# Patient Record
Sex: Male | Born: 1960 | Race: White | Hispanic: No | Marital: Married | Smoking: Never smoker
Health system: Southern US, Community
[De-identification: ages and names within clinical notes are randomized; demographics above are authoritative.]

## PROBLEM LIST (undated history)

## (undated) DIAGNOSIS — Z87442 Personal history of urinary calculi: Secondary | ICD-10-CM

## (undated) DIAGNOSIS — R7302 Impaired glucose tolerance (oral): Secondary | ICD-10-CM

## (undated) DIAGNOSIS — T7840XA Allergy, unspecified, initial encounter: Secondary | ICD-10-CM

## (undated) DIAGNOSIS — E785 Hyperlipidemia, unspecified: Secondary | ICD-10-CM

## (undated) DIAGNOSIS — I10 Essential (primary) hypertension: Secondary | ICD-10-CM

## (undated) DIAGNOSIS — B029 Zoster without complications: Secondary | ICD-10-CM

## (undated) DIAGNOSIS — G459 Transient cerebral ischemic attack, unspecified: Secondary | ICD-10-CM

## (undated) DIAGNOSIS — D229 Melanocytic nevi, unspecified: Secondary | ICD-10-CM

## (undated) HISTORY — PX: WISDOM TOOTH EXTRACTION: SHX21

## (undated) HISTORY — DX: Allergy, unspecified, initial encounter: T78.40XA

## (undated) HISTORY — DX: Zoster without complications: B02.9

## (undated) HISTORY — DX: Impaired glucose tolerance (oral): R73.02

## (undated) HISTORY — PX: OTHER SURGICAL HISTORY: SHX169

## (undated) HISTORY — DX: Personal history of urinary calculi: Z87.442

## (undated) HISTORY — DX: Transient cerebral ischemic attack, unspecified: G45.9

## (undated) HISTORY — DX: Essential (primary) hypertension: I10

## (undated) HISTORY — DX: Hyperlipidemia, unspecified: E78.5

---
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
## 1998-01-19 ENCOUNTER — Encounter: Admission: RE | Admit: 1998-01-19 | Discharge: 1998-01-19 | Payer: Self-pay | Admitting: Family Medicine

## 1998-02-12 ENCOUNTER — Encounter: Admission: RE | Admit: 1998-02-12 | Discharge: 1998-02-12 | Payer: Self-pay | Admitting: Family Medicine

## 1998-03-04 ENCOUNTER — Encounter: Admission: RE | Admit: 1998-03-04 | Discharge: 1998-03-04 | Payer: Self-pay | Admitting: Family Medicine

## 1998-08-27 ENCOUNTER — Encounter: Admission: RE | Admit: 1998-08-27 | Discharge: 1998-08-27 | Payer: Self-pay | Admitting: Family Medicine

## 2000-07-11 ENCOUNTER — Encounter: Payer: Self-pay | Admitting: Urology

## 2000-07-11 ENCOUNTER — Encounter: Admission: RE | Admit: 2000-07-11 | Discharge: 2000-07-11 | Payer: Self-pay | Admitting: Urology

## 2002-02-07 ENCOUNTER — Emergency Department (HOSPITAL_COMMUNITY): Admission: EM | Admit: 2002-02-07 | Discharge: 2002-02-08 | Payer: Self-pay | Admitting: Emergency Medicine

## 2002-02-08 ENCOUNTER — Encounter: Payer: Self-pay | Admitting: Emergency Medicine

## 2002-02-27 ENCOUNTER — Encounter: Payer: Self-pay | Admitting: Urology

## 2002-02-27 ENCOUNTER — Encounter: Admission: RE | Admit: 2002-02-27 | Discharge: 2002-02-27 | Payer: Self-pay | Admitting: Urology

## 2004-09-30 ENCOUNTER — Ambulatory Visit: Payer: Self-pay | Admitting: Internal Medicine

## 2005-01-13 ENCOUNTER — Ambulatory Visit: Payer: Self-pay | Admitting: Internal Medicine

## 2005-04-14 ENCOUNTER — Ambulatory Visit: Payer: Self-pay | Admitting: Internal Medicine

## 2005-04-19 ENCOUNTER — Ambulatory Visit: Payer: Self-pay | Admitting: Internal Medicine

## 2007-09-14 ENCOUNTER — Encounter: Payer: Self-pay | Admitting: Internal Medicine

## 2008-03-28 DIAGNOSIS — G459 Transient cerebral ischemic attack, unspecified: Secondary | ICD-10-CM

## 2008-03-28 HISTORY — DX: Transient cerebral ischemic attack, unspecified: G45.9

## 2008-08-01 ENCOUNTER — Encounter: Admission: RE | Admit: 2008-08-01 | Discharge: 2008-08-01 | Payer: Self-pay | Admitting: Occupational Medicine

## 2009-01-08 ENCOUNTER — Ambulatory Visit: Payer: Self-pay | Admitting: Internal Medicine

## 2009-01-08 DIAGNOSIS — L0291 Cutaneous abscess, unspecified: Secondary | ICD-10-CM | POA: Insufficient documentation

## 2009-01-08 DIAGNOSIS — Z87442 Personal history of urinary calculi: Secondary | ICD-10-CM

## 2009-01-08 DIAGNOSIS — E785 Hyperlipidemia, unspecified: Secondary | ICD-10-CM | POA: Insufficient documentation

## 2009-01-08 DIAGNOSIS — F329 Major depressive disorder, single episode, unspecified: Secondary | ICD-10-CM | POA: Insufficient documentation

## 2009-01-08 DIAGNOSIS — L0390 Cellulitis, unspecified: Secondary | ICD-10-CM

## 2009-01-08 DIAGNOSIS — J309 Allergic rhinitis, unspecified: Secondary | ICD-10-CM | POA: Insufficient documentation

## 2009-01-08 HISTORY — DX: Personal history of urinary calculi: Z87.442

## 2009-01-08 HISTORY — DX: Hyperlipidemia, unspecified: E78.5

## 2009-01-12 ENCOUNTER — Encounter: Payer: Self-pay | Admitting: Internal Medicine

## 2009-01-22 ENCOUNTER — Ambulatory Visit: Payer: Self-pay | Admitting: Internal Medicine

## 2009-01-22 DIAGNOSIS — H6990 Unspecified Eustachian tube disorder, unspecified ear: Secondary | ICD-10-CM | POA: Insufficient documentation

## 2009-01-22 DIAGNOSIS — H6980 Other specified disorders of Eustachian tube, unspecified ear: Secondary | ICD-10-CM | POA: Insufficient documentation

## 2009-02-18 ENCOUNTER — Ambulatory Visit: Payer: Self-pay | Admitting: Internal Medicine

## 2009-02-18 LAB — CONVERTED CEMR LAB
ALT: 43 units/L (ref 0–53)
AST: 23 units/L (ref 0–37)
Albumin: 4.7 g/dL (ref 3.5–5.2)
Alkaline Phosphatase: 50 units/L (ref 39–117)
BUN: 12 mg/dL (ref 6–23)
Basophils Absolute: 0 10*3/uL (ref 0.0–0.1)
Basophils Relative: 0.7 % (ref 0.0–3.0)
Bilirubin Urine: NEGATIVE
Bilirubin, Direct: 0.1 mg/dL (ref 0.0–0.3)
CO2: 30 meq/L (ref 19–32)
Calcium: 9.7 mg/dL (ref 8.4–10.5)
Chloride: 105 meq/L (ref 96–112)
Cholesterol: 219 mg/dL — ABNORMAL HIGH (ref 0–200)
Creatinine, Ser: 1.1 mg/dL (ref 0.4–1.5)
Direct LDL: 167.8 mg/dL
Eosinophils Absolute: 0.1 10*3/uL (ref 0.0–0.7)
Eosinophils Relative: 2.2 % (ref 0.0–5.0)
GFR calc non Af Amer: 76.4 mL/min (ref >60)
Glucose, Bld: 109 mg/dL — ABNORMAL HIGH (ref 70–99)
HCT: 47.4 % (ref 39.0–52.0)
HDL: 47.5 mg/dL (ref >39.00)
Hemoglobin, Urine: NEGATIVE
Hemoglobin: 16.1 g/dL (ref 13.0–17.0)
Ketones, ur: NEGATIVE mg/dL
Leukocytes, UA: NEGATIVE
Lymphocytes Relative: 30.9 % (ref 12.0–46.0)
Lymphs Abs: 1.9 10*3/uL (ref 0.7–4.0)
MCHC: 33.9 g/dL (ref 30.0–36.0)
MCV: 92.9 fL (ref 78.0–100.0)
Monocytes Absolute: 0.5 10*3/uL (ref 0.1–1.0)
Monocytes Relative: 8.2 % (ref 3.0–12.0)
Neutro Abs: 3.5 10*3/uL (ref 1.4–7.7)
Neutrophils Relative %: 58 % (ref 43.0–77.0)
Nitrite: NEGATIVE
PSA: 0.54 ng/mL (ref 0.10–4.00)
Platelets: 232 10*3/uL (ref 150.0–400.0)
Potassium: 4.5 meq/L (ref 3.5–5.1)
RBC: 5.1 M/uL (ref 4.22–5.81)
RDW: 12.7 % (ref 11.5–14.6)
Sodium: 142 meq/L (ref 135–145)
Specific Gravity, Urine: 1.015 (ref 1.000–1.030)
TSH: 1.31 microintl units/mL (ref 0.35–5.50)
Total Bilirubin: 1 mg/dL (ref 0.3–1.2)
Total CHOL/HDL Ratio: 5
Total Protein, Urine: NEGATIVE mg/dL
Total Protein: 7.5 g/dL (ref 6.0–8.3)
Triglycerides: 103 mg/dL (ref 0.0–149.0)
Urine Glucose: NEGATIVE mg/dL
Urobilinogen, UA: 0.2 (ref 0.0–1.0)
VLDL: 20.6 mg/dL (ref 0.0–40.0)
WBC: 6 10*3/uL (ref 4.5–10.5)
pH: 6.5 (ref 5.0–8.0)

## 2009-03-02 ENCOUNTER — Ambulatory Visit: Payer: Self-pay | Admitting: Internal Medicine

## 2009-03-02 DIAGNOSIS — G4482 Headache associated with sexual activity: Secondary | ICD-10-CM | POA: Insufficient documentation

## 2009-08-22 ENCOUNTER — Ambulatory Visit: Payer: Self-pay | Admitting: Family Medicine

## 2009-08-22 DIAGNOSIS — J029 Acute pharyngitis, unspecified: Secondary | ICD-10-CM | POA: Insufficient documentation

## 2009-08-22 DIAGNOSIS — J069 Acute upper respiratory infection, unspecified: Secondary | ICD-10-CM | POA: Insufficient documentation

## 2009-08-22 LAB — CONVERTED CEMR LAB: Rapid Strep: NEGATIVE

## 2009-10-07 ENCOUNTER — Emergency Department (HOSPITAL_COMMUNITY): Admission: EM | Admit: 2009-10-07 | Discharge: 2009-10-07 | Payer: Self-pay | Admitting: Family Medicine

## 2009-10-07 ENCOUNTER — Observation Stay (HOSPITAL_COMMUNITY): Admission: EM | Admit: 2009-10-07 | Discharge: 2009-10-08 | Payer: Self-pay | Admitting: Emergency Medicine

## 2010-04-27 NOTE — Consult Note (Signed)
Summary: Consultation Report/[facility]  Consultation Report/[facility]   Imported By: Lester Dawson 09/24/2007 11:30:47  _____________________________________________________________________  External Attachment:    Type:   Image     Comment:   External Document

## 2010-04-27 NOTE — Consult Note (Signed)
Summary: Abcess/[facility]  Abcess/[facility]   Imported By: Sherian Rein 02/03/2009 09:30:37  _____________________________________________________________________  External Attachment:    Type:   Image     Comment:   External Document

## 2010-04-27 NOTE — Assessment & Plan Note (Signed)
Summary: SORE THROAT/[facility]   Vital Signs:  Patient profile:   50 year old male Weight:      224 pounds Temp:     97.8 degrees F oral BP sitting:   122 / 80  (left arm)  Vitals Entered By: Doristine Devoid (Aug 22, 2009 9:39 AM) CC: sore throat and R ear pain   Acute Visit History:      The patient complains of cough, fever, and sore throat.  He denies chest pain, earache, nasal discharge, and rash.  Other comments include: ringing in right ear using coricidin. Marland Kitchen        His highest temperature has been subjective.        The cough interferes with his sleep.  The character of the cough is described as nonproductive.  There is no history of wheezing or shortness of breath associated with his cough.        The earache is located on the right side.        Problems Prior to Update: 1)  Sore Throat  (ICD-462) 2)  Preventive Health Care  (ICD-V70.0) 3)  Headache Associated With Sexual Activity  (ICD-339.82) 4)  Eustachian Tube Dysfunction, Bilateral  (ICD-381.81) 5)  Depression  (ICD-311) 6)  Nephrolithiasis, Hx of  (ICD-V13.01) 7)  Hyperlipidemia  (ICD-272.4) 8)  Allergic Rhinitis  (ICD-477.9) 9)  Cellulitis and Abscess of Unspecified Site  (ICD-682.9)  Current Medications (verified): 1)  Nasacort Aq 55 Mcg/act Aers (Triamcinolone Acetonide(Nasal)) .... 2 Puffs Each Nostril Once Daily 2)  Indomethacin Cr 75 Mg Cr-Caps (Indomethacin) .Marland Kitchen.. 1 By Mouth Bid As Needed For Headaches 3)  Crestor 20 Mg Tabs (Rosuvastatin Calcium) .Marland Kitchen.. 1 By Mouth Once Daily 4)  Cheratussin Ac 100-10 Mg/27ml Syrp (Guaifenesin-Codeine) .Marland Kitchen.. 1-2 Tsp By Mouth At Bedtime As Needed Cough 5)  Azithromycin 250 Mg Tabs (Azithromycin) .... 2 Tab Ppo X 1 Then 1 Tab By Mouth Daily  Fill If Not Improving in 3-4 Days As Expected With Viral Infection.  Void After June 6th, 2011  Allergies (verified): 1)  Vytorin (Ezetimibe-Simvastatin)  Past History:  Past medical, surgical, family and social histories (including risk  factors) reviewed, and no changes noted (except as noted below).  Past Medical History: Reviewed history from 01/08/2009 and no changes required. Allergic rhinitis Hyperlipidemia Nephrolithiasis, hx of hx of UTI Depression normal stress test 2005  Past Surgical History: Reviewed history from 01/08/2009 and no changes required. s/p ganglion cyst s/p pilonidalcystectomy  Family History: Reviewed history from 01/08/2009 and no changes required. parent with heart disease, HTN mother with HTN, carotid disease  Social History: Reviewed history from 01/08/2009 and no changes required. 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 CV:  Denies chest pain or discomfort. GI:  Denies abdominal pain.  Physical Exam  General:  overwieght male in AND Ears:  clear fluid B TMs, no erythema, no exudate Nose:  External nasal examination shows no deformity or inflammation. Nasal mucosa are pink and moist without lesions or exudates. Mouth:  MMMno exudates, no posterior lymphoid hypertrophy, and pharyngeal erythema.   Neck:  no cervical or supraclavicular lymphadenopathy  Lungs:  Normal respiratory effort, chest expands symmetrically. Lungs are clear to auscultation, no crackles or wheezes. Heart:  Normal rate and regular rhythm. S1 and S2 normal without gallop, murmur, click, rub or other extra sounds.   Impression & Recommendations:  Problem # 1:  URI (ICD-465.9) No  clear bacterial infection, but not improving at day 5-6 ...treat symptomatically for 3-4 more days.Marland Kitchenif not turning the corner, may fill Rx for antibiotics for possible early bronchitis.  His updated medication list for this problem includes:    Indomethacin Cr 75 Mg Cr-caps (Indomethacin) .Marland Kitchen... 1 by mouth bid as needed for headaches    Cheratussin Ac 100-10 Mg/45ml Syrp (Guaifenesin-codeine) .Marland Kitchen... 1-2 tsp by mouth at bedtime as needed  cough  Complete Medication List: 1)  Nasacort Aq 55 Mcg/act Aers (Triamcinolone acetonide(nasal)) .... 2 puffs each nostril once daily 2)  Indomethacin Cr 75 Mg Cr-caps (Indomethacin) .Marland Kitchen.. 1 by mouth bid as needed for headaches 3)  Crestor 20 Mg Tabs (Rosuvastatin calcium) .Marland Kitchen.. 1 by mouth once daily 4)  Cheratussin Ac 100-10 Mg/68ml Syrp (Guaifenesin-codeine) .Marland Kitchen.. 1-2 tsp by mouth at bedtime as needed cough 5)  Azithromycin 250 Mg Tabs (Azithromycin) .... 2 tab ppo x 1 then 1 tab by mouth daily  fill if not improving in 3-4 days as expected with viral infection.  void after june 6th, 2011  Other Orders: Rapid Strep [id number])  Patient Instructions: 1)  Treat sore throat with ibuprofen, cough with mucinex DM and cough suppressant at night. 2)   May fill antibiotics in 7-10 days if not turning the corner.  Prescriptions: AZITHROMYCIN 250 MG TABS (AZITHROMYCIN) 2 tab ppo x 1 then 1 tab by mouth daily  Fill if not improving in 3-4 days as expected with viral infection.  VOID after June 6th, 2011  #6 x 0   Entered and Authorized by:   Kerby Nora MD   Signed by:   Kerby Nora MD on 08/22/2009   Method used:   Print then Give to Patient   RxID:   [id number] CHERATUSSIN AC 100-10 MG/5ML SYRP (GUAIFENESIN-CODEINE) 1-2 tsp by mouth at bedtime as needed cough  #8 oz x 0   Entered and Authorized by:   Kerby Nora MD   Signed by:   Kerby Nora MD on 08/22/2009   Method used:   Print then Give to Patient   RxID:   [id number]   Laboratory Results    Other Tests  Rapid Strep: negative  Kit Test Internal QC: Positive   (Normal Range: Negative)

## 2010-06-13 LAB — COMPREHENSIVE METABOLIC PANEL
ALT: 43 U/L (ref 0–53)
ALT: 48 U/L (ref 0–53)
AST: 25 U/L (ref 0–37)
AST: 31 U/L (ref 0–37)
Albumin: 4 g/dL (ref 3.5–5.2)
Albumin: 4.7 g/dL (ref 3.5–5.2)
Alkaline Phosphatase: 51 U/L (ref 39–117)
Alkaline Phosphatase: 55 U/L (ref 39–117)
BUN: 12 mg/dL (ref 6–23)
BUN: 9 mg/dL (ref 6–23)
CO2: 29 mEq/L (ref 19–32)
CO2: 29 mEq/L (ref 19–32)
Calcium: 9.4 mg/dL (ref 8.4–10.5)
Calcium: 9.9 mg/dL (ref 8.4–10.5)
Chloride: 102 mEq/L (ref 96–112)
Chloride: 104 mEq/L (ref 96–112)
Creatinine, Ser: 1.06 mg/dL (ref 0.4–1.5)
Creatinine, Ser: 1.17 mg/dL (ref 0.4–1.5)
GFR calc Af Amer: >60 mL/min (ref >60)
GFR calc Af Amer: >60 mL/min (ref >60)
GFR calc non Af Amer: >60 mL/min (ref >60)
GFR calc non Af Amer: >60 mL/min (ref >60)
Glucose, Bld: 105 mg/dL — ABNORMAL HIGH (ref 70–99)
Glucose, Bld: 121 mg/dL — ABNORMAL HIGH (ref 70–99)
Potassium: 4.2 mEq/L (ref 3.5–5.1)
Potassium: 4.3 mEq/L (ref 3.5–5.1)
Sodium: 141 mEq/L (ref 135–145)
Sodium: 143 mEq/L (ref 135–145)
Total Bilirubin: 0.4 mg/dL (ref 0.3–1.2)
Total Bilirubin: 1 mg/dL (ref 0.3–1.2)
Total Protein: 6.2 g/dL (ref 6.0–8.3)
Total Protein: 7.4 g/dL (ref 6.0–8.3)

## 2010-06-13 LAB — PROTIME-INR
INR: 0.97 (ref 0.00–1.49)
Prothrombin Time: 12.8 seconds (ref 11.6–15.2)

## 2010-06-13 LAB — DIFFERENTIAL
Basophils Absolute: 0 10*3/uL (ref 0.0–0.1)
Basophils Relative: 0 % (ref 0–1)
Eosinophils Absolute: 0.1 10*3/uL (ref 0.0–0.7)
Eosinophils Relative: 1 % (ref 0–5)
Lymphocytes Relative: 25 % (ref 12–46)
Lymphs Abs: 2.5 10*3/uL (ref 0.7–4.0)
Monocytes Absolute: 0.6 10*3/uL (ref 0.1–1.0)
Monocytes Relative: 6 % (ref 3–12)
Neutro Abs: 6.7 10*3/uL (ref 1.7–7.7)
Neutrophils Relative %: 67 % (ref 43–77)

## 2010-06-13 LAB — CBC
HCT: 45.3 % (ref 39.0–52.0)
HCT: 48 % (ref 39.0–52.0)
Hemoglobin: 15.2 g/dL (ref 13.0–17.0)
Hemoglobin: 16.2 g/dL (ref 13.0–17.0)
MCH: 30.7 pg (ref 26.0–34.0)
MCH: 30.8 pg (ref 26.0–34.0)
MCHC: 33.6 g/dL (ref 30.0–36.0)
MCHC: 33.8 g/dL (ref 30.0–36.0)
MCV: 90.7 fL (ref 78.0–100.0)
MCV: 91.5 fL (ref 78.0–100.0)
Platelets: 221 10*3/uL (ref 150–400)
Platelets: 257 10*3/uL (ref 150–400)
RBC: 4.95 MIL/uL (ref 4.22–5.81)
RBC: 5.29 MIL/uL (ref 4.22–5.81)
RDW: 13.2 % (ref 11.5–15.5)
RDW: 13.3 % (ref 11.5–15.5)
WBC: 7.3 10*3/uL (ref 4.0–10.5)
WBC: 9.9 10*3/uL (ref 4.0–10.5)

## 2010-06-13 LAB — CARDIAC PANEL(CRET KIN+CKTOT+MB+TROPI)
CK, MB: 2.3 ng/mL (ref 0.3–4.0)
CK, MB: 3 ng/mL (ref 0.3–4.0)
Relative Index: 0.9 (ref 0.0–2.5)
Relative Index: 1.2 (ref 0.0–2.5)
Total CK: 256 U/L — ABNORMAL HIGH (ref 7–232)
Total CK: 260 U/L — ABNORMAL HIGH (ref 7–232)
Troponin I: <0.01 ng/mL (ref 0.00–0.06)
Troponin I: <0.01 ng/mL (ref 0.00–0.06)

## 2010-06-13 LAB — HEMOGLOBIN A1C
Hgb A1c MFr Bld: 6 % — ABNORMAL HIGH (ref <5.7)
Mean Plasma Glucose: 126 mg/dL — ABNORMAL HIGH (ref <117)

## 2010-06-13 LAB — TSH: TSH: 3.956 u[IU]/mL (ref 0.350–4.500)

## 2010-06-13 LAB — ACETYLCHOLINE RECEPTOR, BINDING: Acetylcholine Receptor Ab: <0.3 nmol/L (ref <=0.30)

## 2010-06-13 LAB — GLUCOSE, CAPILLARY
Glucose-Capillary: 125 mg/dL — ABNORMAL HIGH (ref 70–99)
Glucose-Capillary: 133 mg/dL — ABNORMAL HIGH (ref 70–99)

## 2010-06-13 LAB — MAGNESIUM: Magnesium: 2.2 mg/dL (ref 1.5–2.5)

## 2010-06-13 LAB — CK TOTAL AND CKMB (NOT AT ARMC)
CK, MB: 2.9 ng/mL (ref 0.3–4.0)
Relative Index: 1.1 (ref 0.0–2.5)
Total CK: 270 U/L — ABNORMAL HIGH (ref 7–232)

## 2010-06-13 LAB — MYASTHENIA GRAVIS PANEL 2

## 2010-06-13 LAB — PHOSPHORUS: Phosphorus: 3.3 mg/dL (ref 2.3–4.6)

## 2010-06-13 LAB — SEDIMENTATION RATE: Sed Rate: 1 mm/hr (ref 0–16)

## 2010-06-13 LAB — B. BURGDORFI ANTIBODIES: B burgdorferi Ab IgG+IgM: 0.21 {ISR}

## 2010-06-13 LAB — TROPONIN I: Troponin I: <0.01 ng/mL (ref 0.00–0.06)

## 2010-07-02 ENCOUNTER — Other Ambulatory Visit: Payer: Self-pay | Admitting: Internal Medicine

## 2010-07-06 ENCOUNTER — Telehealth: Payer: Self-pay | Admitting: Internal Medicine

## 2010-07-06 NOTE — Telephone Encounter (Signed)
Pt needs 2-3 mos of refills of Crestor. Pt made appt for CPX on June 26th (next available appt for CPX). [facility] [address] is his pharmacy.

## 2010-07-07 MED ORDER — ROSUVASTATIN CALCIUM 20 MG PO TABS: 20.0000 mg | ORAL_TABLET | Freq: Every day | ORAL | Status: DC

## 2010-09-14 ENCOUNTER — Other Ambulatory Visit (INDEPENDENT_AMBULATORY_CARE_PROVIDER_SITE_OTHER): Payer: 59

## 2010-09-14 ENCOUNTER — Other Ambulatory Visit: Payer: Self-pay | Admitting: Internal Medicine

## 2010-09-14 DIAGNOSIS — Z0000 Encounter for general adult medical examination without abnormal findings: Secondary | ICD-10-CM

## 2010-09-14 DIAGNOSIS — Z0389 Encounter for observation for other suspected diseases and conditions ruled out: Secondary | ICD-10-CM

## 2010-09-14 LAB — CBC WITH DIFFERENTIAL/PLATELET
Basophils Absolute: 0 10*3/uL (ref 0.0–0.1)
Basophils Relative: 0.4 % (ref 0.0–3.0)
Eosinophils Absolute: 0.2 10*3/uL (ref 0.0–0.7)
Eosinophils Relative: 2.5 % (ref 0.0–5.0)
HCT: 47.6 % (ref 39.0–52.0)
Hemoglobin: 16.4 g/dL (ref 13.0–17.0)
Lymphocytes Relative: 30.4 % (ref 12.0–46.0)
Lymphs Abs: 2.1 10*3/uL (ref 0.7–4.0)
MCHC: 34.5 g/dL (ref 30.0–36.0)
MCV: 91.5 fl (ref 78.0–100.0)
Monocytes Absolute: 0.6 10*3/uL (ref 0.1–1.0)
Monocytes Relative: 8.7 % (ref 3.0–12.0)
Neutro Abs: 4.1 10*3/uL (ref 1.4–7.7)
Neutrophils Relative %: 58 % (ref 43.0–77.0)
Platelets: 281 10*3/uL (ref 150.0–400.0)
RBC: 5.2 Mil/uL (ref 4.22–5.81)
RDW: 13.5 % (ref 11.5–14.6)
WBC: 7 10*3/uL (ref 4.5–10.5)

## 2010-09-14 LAB — LIPID PANEL
Cholesterol: 154 mg/dL (ref 0–200)
HDL: 44.6 mg/dL (ref >39.00)
LDL Cholesterol: 88 mg/dL (ref 0–99)
Total CHOL/HDL Ratio: 3
Triglycerides: 108 mg/dL (ref 0.0–149.0)
VLDL: 21.6 mg/dL (ref 0.0–40.0)

## 2010-09-14 LAB — URINALYSIS
Bilirubin Urine: NEGATIVE
Hgb urine dipstick: NEGATIVE
Ketones, ur: NEGATIVE
Leukocytes, UA: NEGATIVE
Nitrite: NEGATIVE
Specific Gravity, Urine: 1.02 (ref 1.000–1.030)
Total Protein, Urine: NEGATIVE
Urine Glucose: NEGATIVE
Urobilinogen, UA: 0.2 (ref 0.0–1.0)
pH: 7 (ref 5.0–8.0)

## 2010-09-14 LAB — HEPATIC FUNCTION PANEL
ALT: 47 U/L (ref 0–53)
AST: 28 U/L (ref 0–37)
Albumin: 5 g/dL (ref 3.5–5.2)
Alkaline Phosphatase: 52 U/L (ref 39–117)
Bilirubin, Direct: 0.2 mg/dL (ref 0.0–0.3)
Total Bilirubin: 0.7 mg/dL (ref 0.3–1.2)
Total Protein: 7.1 g/dL (ref 6.0–8.3)

## 2010-09-14 LAB — TSH: TSH: 1.94 u[IU]/mL (ref 0.35–5.50)

## 2010-09-14 LAB — PSA: PSA: 0.6 ng/mL (ref 0.10–4.00)

## 2010-09-14 LAB — BASIC METABOLIC PANEL
BUN: 15 mg/dL (ref 6–23)
CO2: 29 mEq/L (ref 19–32)
Calcium: 9.7 mg/dL (ref 8.4–10.5)
Chloride: 105 mEq/L (ref 96–112)
Creatinine, Ser: 0.9 mg/dL (ref 0.4–1.5)
GFR: 92.12 mL/min (ref >60.00)
Glucose, Bld: 92 mg/dL (ref 70–99)
Potassium: 4.4 mEq/L (ref 3.5–5.1)
Sodium: 140 mEq/L (ref 135–145)

## 2010-09-20 ENCOUNTER — Encounter: Payer: Self-pay | Admitting: Internal Medicine

## 2010-09-21 ENCOUNTER — Encounter: Payer: Self-pay | Admitting: Internal Medicine

## 2010-09-21 ENCOUNTER — Ambulatory Visit (INDEPENDENT_AMBULATORY_CARE_PROVIDER_SITE_OTHER): Payer: 59 | Admitting: Internal Medicine

## 2010-09-21 VITALS — BP 110/78 | HR 82 | Temp 99.3°F | Ht 67.0 in | Wt 222.5 lb

## 2010-09-21 DIAGNOSIS — J309 Allergic rhinitis, unspecified: Secondary | ICD-10-CM

## 2010-09-21 DIAGNOSIS — R7302 Impaired glucose tolerance (oral): Secondary | ICD-10-CM

## 2010-09-21 DIAGNOSIS — Z0000 Encounter for general adult medical examination without abnormal findings: Secondary | ICD-10-CM

## 2010-09-21 DIAGNOSIS — H532 Diplopia: Secondary | ICD-10-CM | POA: Insufficient documentation

## 2010-09-21 DIAGNOSIS — Z0001 Encounter for general adult medical examination with abnormal findings: Secondary | ICD-10-CM | POA: Insufficient documentation

## 2010-09-21 DIAGNOSIS — E785 Hyperlipidemia, unspecified: Secondary | ICD-10-CM

## 2010-09-21 HISTORY — DX: Impaired glucose tolerance (oral): R73.02

## 2010-09-21 MED ORDER — ROSUVASTATIN CALCIUM 20 MG PO TABS: 20.0000 mg | ORAL_TABLET | Freq: Every day | ORAL | Status: DC

## 2010-09-21 MED ORDER — FLUTICASONE PROPIONATE 50 MCG/ACT NA SUSP: 2.0000 | Freq: Every day | NASAL | Status: DC

## 2010-09-21 NOTE — Patient Instructions (Signed)
Take all new medications as prescribed Continue all other medications as before Please get regular exercise for wt loss, and follow low cholesterol diet Please return in 1 year for your yearly visit, or sooner if needed, with Lab testing done 3-5 days before

## 2010-09-21 NOTE — Assessment & Plan Note (Signed)

## 2010-09-21 NOTE — Assessment & Plan Note (Signed)
stable overall by hx and exam, most recent data reviewed with pt, and pt to continue medical treatment as before  Lab Results  Component Value Date   LDLCALC 88 09/14/2010

## 2010-09-21 NOTE — Progress Notes (Signed)
Subjective:    Patient ID: Brett Griffith, male    DOB: 1960/05/12, 50 y.o.   MRN: [id number]  HPI Here for wellness and f/u;  Overall doing ok;  Pt denies CP, worsening SOB, DOE, wheezing, orthopnea, PND, worsening LE edema, palpitations, dizziness or syncope.  Pt denies neurological change such as new Headache, facial or extremity weakness.  Pt denies polydipsia, polyuria, or low sugar symptoms. Pt states overall good compliance with treatment and medications, good tolerability, and trying to follow lower cholesterol diet.  Pt denies worsening depressive symptoms, suicidal ideation or panic. No fever, wt loss, night sweats, loss of appetite, or other constitutional symptoms.  Pt states good ability with ADL's, low fall risk, home safety reviewed and adequate, no significant changes in hearing or vision, and occasionally active with exercise. Sees Derm once yearly. Past Medical History  Diagnosis Date  . ALLERGIC RHINITIS 01/08/2009  . Cellulitis and abscess of unspecified site 01/08/2009  . DEPRESSION 01/08/2009  . 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 Headache associated with sexual activity 03/02/2009  . HYPERLIPIDEMIA 01/08/2009  . NEPHROLITHIASIS, HX OF 01/08/2009  . SORE THROAT 08/22/2009  . URI 08/22/2009  . Transient diplopia 09/21/2010  . Impaired glucose tolerance 09/21/2010   Past Surgical History  Procedure Date  . S/p ganglion cyst   . S/p pilonidalcystectomy     reports that he has never smoked. He does not have any smokeless tobacco history on file. He reports that he drinks alcohol. His drug history not on file. family history includes Heart disease in his father and mother and Hypertension in his father and mother. Allergies  Allergen Reactions  . Ezetimibe-Simvastatin     REACTION: more emotional   Current Outpatient Prescriptions on File Prior to Visit  Medication Sig Dispense Refill  . Indomethacin 75 MG CPCR Take by mouth 2 (two) times daily as needed.         Marland Kitchen DISCONTD: rosuvastatin (CRESTOR) 20 MG tablet Take 1 tablet (20 mg total) by mouth at bedtime.  30 tablet  2  . guaiFENesin-codeine (ROBITUSSIN AC) 100-10 MG/5ML syrup 1-2 tsp by mouth at bedtime as needed coudh       . DISCONTD: triamcinolone (NASACORT AQ) 55 MCG/ACT nasal inhaler Place 2 sprays into the nose daily.         Review of Systems Review of Systems  Constitutional: Negative for diaphoresis, activity change, appetite change and unexpected weight change.  HENT: Negative for hearing loss, ear pain, facial swelling, mouth sores and neck stiffness.   Eyes: Negative for pain, redness and visual disturbance.  Respiratory: Negative for shortness of breath and wheezing.   Cardiovascular: Negative for chest pain and palpitations.  Gastrointestinal: Negative for diarrhea, blood in stool, abdominal distention and rectal pain.  Genitourinary: Negative for hematuria, flank pain and decreased urine volume.  Musculoskeletal: Negative for myalgias and joint swelling.  Skin: Negative for color change and wound.  Neurological: Negative for syncope and numbness.  Hematological: Negative for adenopathy.  Psychiatric/Behavioral: Negative for hallucinations, self-injury, decreased concentration and agitation.      Objective:   Physical Exam BP 110/78  Pulse 82  Temp(Src) 99.3 F (37.4 C) (Oral)  Ht 5\' 7"  (1.702 m)  Wt 222 lb 8 oz (100.925 kg)  BMI 34.85 kg/m2  SpO2 95% Physical Exam  VS noted Constitutional: Pt is oriented to person, place, and time. Appears well-developed and well-nourished.  HENT:  Head: Normocephalic and atraumatic.  Right Ear: External ear normal.  Left  Ear: External ear normal.  Nose: Nose normal.  Mouth/Throat: Oropharynx is clear and moist.  Eyes: Conjunctivae and EOM are normal. Pupils are equal, round, and reactive to light.  Neck: Normal range of motion. Neck supple. No JVD present. No tracheal deviation present.  Cardiovascular: Normal rate, regular  rhythm, normal heart sounds and intact distal pulses.   Pulmonary/Chest: Effort normal and breath sounds normal.  Abdominal: Soft. Bowel sounds are normal. There is no tenderness.  Musculoskeletal: Normal range of motion. Exhibits no edema.  Lymphadenopathy:  Has no cervical adenopathy.  Neurological: Pt is alert and oriented to person, place, and time. Pt has normal reflexes. No cranial nerve deficit.  Skin: Skin is warm and dry. No rash noted.  Psychiatric:  Has  normal mood and affect. Behavior is normal.         Assessment & Plan:

## 2010-09-21 NOTE — Assessment & Plan Note (Signed)
Mild to mod, for flonas asd,  to f/u any worsening symptoms or concerns

## 2011-09-23 ENCOUNTER — Ambulatory Visit (INDEPENDENT_AMBULATORY_CARE_PROVIDER_SITE_OTHER): Payer: 59 | Admitting: Internal Medicine

## 2011-09-23 ENCOUNTER — Encounter: Payer: Self-pay | Admitting: Internal Medicine

## 2011-09-23 VITALS — BP 112/80 | HR 70 | Temp 97.1°F | Ht 67.0 in | Wt 219.0 lb

## 2011-09-23 DIAGNOSIS — J309 Allergic rhinitis, unspecified: Secondary | ICD-10-CM

## 2011-09-23 DIAGNOSIS — Z0000 Encounter for general adult medical examination without abnormal findings: Secondary | ICD-10-CM

## 2011-09-23 DIAGNOSIS — L259 Unspecified contact dermatitis, unspecified cause: Secondary | ICD-10-CM | POA: Insufficient documentation

## 2011-09-23 DIAGNOSIS — E785 Hyperlipidemia, unspecified: Secondary | ICD-10-CM

## 2011-09-23 MED ORDER — FLUTICASONE PROPIONATE 50 MCG/ACT NA SUSP: 2.0000 | Freq: Every day | NASAL | Status: DC

## 2011-09-23 MED ORDER — HYDROXYZINE HCL 25 MG PO TABS: ORAL_TABLET | ORAL | Status: DC

## 2011-09-23 MED ORDER — ROSUVASTATIN CALCIUM 20 MG PO TABS: 20.0000 mg | ORAL_TABLET | Freq: Every day | ORAL | Status: DC

## 2011-09-23 MED ORDER — METHYLPREDNISOLONE ACETATE 80 MG/ML IJ SUSP
120.0000 mg | Freq: Once | INTRAMUSCULAR | Status: AC
  Administered 2011-09-23: 120 mg via INTRAMUSCULAR

## 2011-09-23 MED ORDER — PREDNISONE 10 MG PO TABS: ORAL_TABLET | ORAL | Status: DC

## 2011-09-23 NOTE — Patient Instructions (Addendum)
You had the steroid shot today Take all new medications as prescribed  - the prednisone, and hydroxizine for itching You can also use benadryl cream topical to help with itching as well Your regular medications were all refilled today as well Please return in 3 mo with Lab testing done 3-5 days before

## 2011-09-24 ENCOUNTER — Encounter: Payer: Self-pay | Admitting: Internal Medicine

## 2011-09-24 NOTE — Progress Notes (Signed)
Subjective:    Patient ID: Brett Griffith, male    DOB: June 05, 1960, 51 y.o.   MRN: [id number]  HPI  Here to f/u; [place] with lots of "vines" now with numerous red itchy areas to arms, legs and anterior chest (which he cant scratch and bothers him to no end when he wears his body armor);   Pt denies fever, wt loss, night sweats, loss of appetite, or other constitutional symptoms  Pt denies chest pain, increased sob or doe, wheezing, orthopnea, PND, increased LE swelling, palpitations, dizziness or syncope.  No tongue or lip swelling, wheezing.  Pt denies new neurological symptoms such as new headache, or facial or extremity weakness or numbness   Pt denies polydipsia, polyuria.  Trying to follow lower chol diet, not always successful. Out of crestor for the past month, and Does have several wks ongoing nasal allergy symptoms with clear congestion, itch and sneeze, without fever, pain, ST, cough or wheezing - out of flonase for over a month. Past Medical History  Diagnosis Date  . ALLERGIC RHINITIS 01/08/2009  . Cellulitis and abscess of unspecified site 01/08/2009  . DEPRESSION 01/08/2009  . 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 Headache associated with sexual activity 03/02/2009  . HYPERLIPIDEMIA 01/08/2009  . NEPHROLITHIASIS, HX OF 01/08/2009  . SORE THROAT 08/22/2009  . URI 08/22/2009  . Transient diplopia 09/21/2010  . Impaired glucose tolerance 09/21/2010   Past Surgical History  Procedure Date  . S/p ganglion cyst   . S/p pilonidalcystectomy     reports that he has never smoked. He does not have any smokeless tobacco history on file. He reports that he drinks alcohol. His drug history not on file. family history includes Heart disease in his father and mother and Hypertension in his father and mother. Allergies  Allergen Reactions  . Ezetimibe-Simvastatin     REACTION: more emotional   Current Outpatient Prescriptions on File Prior to Visit    Medication Sig Dispense Refill  . Indomethacin 75 MG CPCR Take by mouth 2 (two) times daily as needed.        . fluticasone (FLONASE) 50 MCG/ACT nasal spray Place 2 sprays into the nose daily.  16 g  2  . rosuvastatin (CRESTOR) 20 MG tablet Take 1 tablet (20 mg total) by mouth at bedtime.  90 tablet  3   No current facility-administered medications on file prior to visit.   Review of Systems Review of Systems  Constitutional: Negative for diaphoresis and unexpected weight change.  Eyes: Negative for photophobia and visual disturbance.  Respiratory: Negative for choking and stridor.   Gastrointestinal: Negative for vomiting and blood in stool.  Genitourinary: Negative for hematuria and decreased urine volume.  Musculoskeletal: Negative for gait problem.  Neurological: Negative for tremors and numbness.    Objective:   Physical Exam BP 112/80  Pulse 70  Temp 97.1 F (36.2 C) (Oral)  Ht 5\' 7"  (1.702 m)  Wt 219 lb (99.338 kg)  BMI 34.30 kg/m2  SpO2 95% Physical Exam  VS noted Constitutional: Pt appears well-developed and well-nourished.  HENT: Head: Normocephalic.  Right Ear: External ear normal.  Left Ear: External ear normal.  Eyes: Conjunctivae and EOM are normal. Pupils are equal, round, and reactive to light.  Neck: Normal range of motion. Neck supple.  Cardiovascular: Normal rate and regular rhythm.   Pulmonary/Chest: Effort normal and breath sounds normal.  Skin: Skin is warm. Multiple areas to arms and leg, left ant chest  with typical rash c/w poison ivy Psychiatric: Pt behavior is normal. Thought content normal.     Assessment & Plan:

## 2011-09-24 NOTE — Assessment & Plan Note (Signed)
Mild to mod, for depomedrol IM, and predpack asd, atarax prn for itching,,  to f/u any worsening symptoms or concerns

## 2011-09-24 NOTE — Assessment & Plan Note (Signed)
stable overall by hx and exam, most recent data reviewed with pt, and pt to continue medical treatment as before Lab Results  Component Value Date   LDLCALC 88 09/14/2010   For cont'd diet and re-start crestor

## 2011-09-24 NOTE — Assessment & Plan Note (Signed)
Mild to mod, for flonase restart,  to f/u any worsening symptoms or concerns 

## 2011-12-19 ENCOUNTER — Other Ambulatory Visit (INDEPENDENT_AMBULATORY_CARE_PROVIDER_SITE_OTHER): Payer: 59

## 2011-12-19 DIAGNOSIS — Z0000 Encounter for general adult medical examination without abnormal findings: Secondary | ICD-10-CM

## 2011-12-19 LAB — CBC WITH DIFFERENTIAL/PLATELET
Basophils Absolute: 0 10*3/uL (ref 0.0–0.1)
Basophils Relative: 0.6 % (ref 0.0–3.0)
Eosinophils Absolute: 0.2 10*3/uL (ref 0.0–0.7)
Eosinophils Relative: 2.2 % (ref 0.0–5.0)
HCT: 46.3 % (ref 39.0–52.0)
Hemoglobin: 15.4 g/dL (ref 13.0–17.0)
Lymphocytes Relative: 28.2 % (ref 12.0–46.0)
Lymphs Abs: 2 10*3/uL (ref 0.7–4.0)
MCHC: 33.2 g/dL (ref 30.0–36.0)
MCV: 91.2 fl (ref 78.0–100.0)
Monocytes Absolute: 0.7 10*3/uL (ref 0.1–1.0)
Monocytes Relative: 9.1 % (ref 3.0–12.0)
Neutro Abs: 4.3 10*3/uL (ref 1.4–7.7)
Neutrophils Relative %: 59.9 % (ref 43.0–77.0)
Platelets: 267 10*3/uL (ref 150.0–400.0)
RBC: 5.08 Mil/uL (ref 4.22–5.81)
RDW: 13.2 % (ref 11.5–14.6)
WBC: 7.2 10*3/uL (ref 4.5–10.5)

## 2011-12-19 LAB — URINALYSIS, ROUTINE W REFLEX MICROSCOPIC
Bilirubin Urine: NEGATIVE
Hgb urine dipstick: NEGATIVE
Ketones, ur: NEGATIVE
Leukocytes, UA: NEGATIVE
Nitrite: NEGATIVE
Specific Gravity, Urine: 1.015 (ref 1.000–1.030)
Total Protein, Urine: NEGATIVE
Urine Glucose: NEGATIVE
Urobilinogen, UA: 0.2 (ref 0.0–1.0)
pH: 7 (ref 5.0–8.0)

## 2011-12-20 LAB — HEPATIC FUNCTION PANEL
ALT: 44 U/L (ref 0–53)
AST: 28 U/L (ref 0–37)
Albumin: 4.6 g/dL (ref 3.5–5.2)
Alkaline Phosphatase: 48 U/L (ref 39–117)
Bilirubin, Direct: 0.1 mg/dL (ref 0.0–0.3)
Total Bilirubin: 0.6 mg/dL (ref 0.3–1.2)
Total Protein: 6.9 g/dL (ref 6.0–8.3)

## 2011-12-20 LAB — BASIC METABOLIC PANEL
BUN: 16 mg/dL (ref 6–23)
CO2: 26 mEq/L (ref 19–32)
Calcium: 9.7 mg/dL (ref 8.4–10.5)
Chloride: 104 mEq/L (ref 96–112)
Creatinine, Ser: 1 mg/dL (ref 0.4–1.5)
GFR: 81.45 mL/min (ref >60.00)
Glucose, Bld: 103 mg/dL — ABNORMAL HIGH (ref 70–99)
Potassium: 5.4 mEq/L — ABNORMAL HIGH (ref 3.5–5.1)
Sodium: 141 mEq/L (ref 135–145)

## 2011-12-20 LAB — PSA: PSA: 0.49 ng/mL (ref 0.10–4.00)

## 2011-12-20 LAB — LIPID PANEL
Cholesterol: 157 mg/dL (ref 0–200)
HDL: 49.7 mg/dL (ref >39.00)
LDL Cholesterol: 97 mg/dL (ref 0–99)
Total CHOL/HDL Ratio: 3
Triglycerides: 50 mg/dL (ref 0.0–149.0)
VLDL: 10 mg/dL (ref 0.0–40.0)

## 2011-12-20 LAB — TSH: TSH: 1.06 u[IU]/mL (ref 0.35–5.50)

## 2011-12-26 ENCOUNTER — Ambulatory Visit (INDEPENDENT_AMBULATORY_CARE_PROVIDER_SITE_OTHER): Payer: 59 | Admitting: Internal Medicine

## 2011-12-26 ENCOUNTER — Encounter: Payer: Self-pay | Admitting: Internal Medicine

## 2011-12-26 VITALS — BP 110/68 | HR 78 | Temp 97.0°F | Ht 67.0 in | Wt 225.4 lb

## 2011-12-26 DIAGNOSIS — J309 Allergic rhinitis, unspecified: Secondary | ICD-10-CM

## 2011-12-26 DIAGNOSIS — R7309 Other abnormal glucose: Secondary | ICD-10-CM

## 2011-12-26 DIAGNOSIS — Z23 Encounter for immunization: Secondary | ICD-10-CM

## 2011-12-26 DIAGNOSIS — E785 Hyperlipidemia, unspecified: Secondary | ICD-10-CM

## 2011-12-26 DIAGNOSIS — R7302 Impaired glucose tolerance (oral): Secondary | ICD-10-CM

## 2011-12-26 DIAGNOSIS — Z0000 Encounter for general adult medical examination without abnormal findings: Secondary | ICD-10-CM

## 2011-12-26 MED ORDER — FLUTICASONE PROPIONATE 50 MCG/ACT NA SUSP: 2.0000 | Freq: Every day | NASAL | Status: DC

## 2011-12-26 MED ORDER — ROSUVASTATIN CALCIUM 20 MG PO TABS: 20.0000 mg | ORAL_TABLET | Freq: Every day | ORAL | Status: DC

## 2011-12-26 NOTE — Progress Notes (Signed)
Subjective:    Patient ID: Brett Griffith, male    DOB: November 22, 1960, 51 y.o.   MRN: [id number]  HPI  Here for wellness and f/u;  Overall doing ok;  Pt denies CP, worsening SOB, DOE, wheezing, orthopnea, PND, worsening LE edema, palpitations, dizziness or syncope.  Pt denies neurological change such as new Headache, facial or extremity weakness.  Pt denies polydipsia, polyuria, or low sugar symptoms. Pt states overall good compliance with treatment and medications, good tolerability, and trying to follow lower cholesterol diet.  Pt denies worsening depressive symptoms, suicidal ideation or panic. No fever, wt loss, night sweats, loss of appetite, or other constitutional symptoms.  Pt states good ability with ADL's, low fall risk, home safety reviewed and adequate, no significant changes in hearing or vision, and occasionally active with exercise.  Still wears size 38 pant for 25 yrs, no change.   Past Medical History  Diagnosis Date  . ALLERGIC RHINITIS 01/08/2009  . Cellulitis and abscess of unspecified site 01/08/2009  . DEPRESSION 01/08/2009  . 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 Headache associated with sexual activity 03/02/2009  . HYPERLIPIDEMIA 01/08/2009  . NEPHROLITHIASIS, HX OF 01/08/2009  . SORE THROAT 08/22/2009  . URI 08/22/2009  . Transient diplopia 09/21/2010  . Impaired glucose tolerance 09/21/2010   Past Surgical History  Procedure Date  . S/p ganglion cyst   . S/p pilonidalcystectomy     reports that he has never smoked. He does not have any smokeless tobacco history on file. He reports that he drinks alcohol. His drug history not on file. family history includes Heart disease in his father and mother and Hypertension in his father and mother. Allergies  Allergen Reactions  . Ezetimibe-Simvastatin     REACTION: more emotional   Current Outpatient Prescriptions on File Prior to Visit  Medication Sig Dispense Refill  . fluticasone (FLONASE) 50 MCG/ACT nasal  spray Place 2 sprays into the nose daily.  16 g  2  . hydrOXYzine (ATARAX/VISTARIL) 25 MG tablet 1-2 tabs by mouth every 6-8 hours as needed for itching  60 tablet  1  . Indomethacin 75 MG CPCR Take by mouth 2 (two) times daily as needed.        . predniSONE (DELTASONE) 10 MG tablet 3 tabs by mouth per day for 3 days,2tabs per day for 3 days,1tab per day for 3 days  18 tablet  0  . rosuvastatin (CRESTOR) 20 MG tablet Take 1 tablet (20 mg total) by mouth at bedtime.  90 tablet  3   Review of Systems  Review of Systems  Constitutional: Negative for diaphoresis, activity change, appetite change and unexpected weight change.  HENT: Negative for hearing loss, ear pain, facial swelling, mouth sores and neck stiffness.   Eyes: Negative for pain, redness and visual disturbance.  Respiratory: Negative for shortness of breath and wheezing.   Cardiovascular: Negative for chest pain and palpitations.  Gastrointestinal: Negative for diarrhea, blood in stool, abdominal distention and rectal pain.  Genitourinary: Negative for hematuria, flank pain and decreased urine volume.  Musculoskeletal: Negative for myalgias and joint swelling.  Skin: Negative for color change and wound.  Neurological: Negative for syncope and numbness.  Hematological: Negative for adenopathy.  Psychiatric/Behavioral: Negative for hallucinations, self-injury, decreased concentration and agitation.      Objective:   Physical Exam BP 110/68  Pulse 78  Temp 97 F (36.1 C) (Oral)  Ht 5\' 7"  (1.702 m)  Wt 225 lb 6 oz (102.229 kg)  BMI 35.30 kg/m2  SpO2 92% Physical Exam  VS noted Constitutional: Pt is oriented to person, place, and time. Appears well-developed and well-nourished.  HENT:  Head: Normocephalic and atraumatic.  Right Ear: External ear normal.  Left Ear: External ear normal.  Nose: Nose normal.  Mouth/Throat: Oropharynx is clear and moist.  Eyes: Conjunctivae and EOM are normal. Pupils are equal, round, and  reactive to light.  Neck: Normal range of motion. Neck supple. No JVD present. No tracheal deviation present.  Cardiovascular: Normal rate, regular rhythm, normal heart sounds and intact distal pulses.   Pulmonary/Chest: Effort normal and breath sounds normal.  Abdominal: Soft. Bowel sounds are normal. There is no tenderness.  Musculoskeletal: Normal range of motion. Exhibits no edema.  Lymphadenopathy:  Has no cervical adenopathy.  Neurological: Pt is alert and oriented to person, place, and time. Pt has normal reflexes. No cranial nerve deficit.  Skin: Skin is warm and dry. No rash noted.  Psychiatric:  Has  normal mood and affect. Behavior is normal.     Assessment & Plan:

## 2011-12-26 NOTE — Patient Instructions (Addendum)
You had the flu shot today You are given the lab test results today Continue all other medications as before Your refills were done today Please continue your efforts at being more active, low cholesterol diet, and weight control. Please return in 1 year for your yearly visit, or sooner if needed, with Lab testing done 3-5 days before

## 2012-07-18 ENCOUNTER — Ambulatory Visit (INDEPENDENT_AMBULATORY_CARE_PROVIDER_SITE_OTHER)
Admission: RE | Admit: 2012-07-18 | Discharge: 2012-07-18 | Disposition: A | Discharge status: Home / Self Care | Payer: 59 | Source: Ambulatory Visit | Attending: Internal Medicine | Admitting: Internal Medicine

## 2012-07-18 ENCOUNTER — Ambulatory Visit (INDEPENDENT_AMBULATORY_CARE_PROVIDER_SITE_OTHER): Payer: 59 | Admitting: Internal Medicine

## 2012-07-18 ENCOUNTER — Encounter: Payer: Self-pay | Admitting: Internal Medicine

## 2012-07-18 VITALS — BP 120/80 | HR 68 | Temp 99.0°F | Ht 67.0 in | Wt 224.4 lb

## 2012-07-18 DIAGNOSIS — M542 Cervicalgia: Secondary | ICD-10-CM

## 2012-07-18 DIAGNOSIS — R7302 Impaired glucose tolerance (oral): Secondary | ICD-10-CM

## 2012-07-18 DIAGNOSIS — F329 Major depressive disorder, single episode, unspecified: Secondary | ICD-10-CM

## 2012-07-18 DIAGNOSIS — R7309 Other abnormal glucose: Secondary | ICD-10-CM

## 2012-07-18 NOTE — Assessment & Plan Note (Signed)
stable overall by history and exam, recent data reviewed with pt, and pt to continue medical treatment as before,  to f/u any worsening symptoms or concerns Lab Results  Component Value Date   HGBA1C  Value: 6.0 (NOTE)                                                                       According to the ADA Clinical Practice Recommendations for 2011, when HbA1c is used as a screening test:   >=6.5%   Diagnostic of Diabetes Mellitus           (if abnormal result  is confirmed)  5.7-6.4%   Increased risk of developing Diabetes Mellitus  References:Diagnosis and Classification of Diabetes Mellitus,Diabetes Care,2011,34(Suppl 1):S62-S69 and Standards of Medical Care in         Diabetes - 2011,Diabetes Care,2011,34  (Suppl 1):S11-S61.* 10/08/2009
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
## 2012-07-18 NOTE — Patient Instructions (Addendum)
OK to continue the alleve as needed Please go to the XRAY Department in the Basement (go straight as you get off the elevator) for the x-ray testing No other blood work needed today Please continue all other medications as before, and refills have been done if requested. You will be contacted by phone if any changes need to be made immediately.  Otherwise, you will receive a letter about your results with an explanation, but please check with MyChart first. Thank you for enrolling in MyChart. Please follow the instructions below to securely access your online medical record. MyChart allows you to send messages to your doctor, view your test results, renew your prescriptions, schedule appointments, and more. To Log into My Chart online, please go by [employer] or [employer] to [email or web address], or download the MyChart App from the [employer] .  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

## 2012-07-18 NOTE — Assessment & Plan Note (Signed)
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

## 2012-07-18 NOTE — Assessment & Plan Note (Signed)
stable overall by history and exam, recent data reviewed with pt, and pt to continue medical treatment as before,  to f/u any worsening symptoms or concerns Lab Results  Component Value Date   WBC 7.2 12/19/2011   HGB 15.4 12/19/2011   HCT 46.3 12/19/2011   PLT 267.0 12/19/2011   GLUCOSE 103* 12/19/2011   CHOL 157 12/19/2011   TRIG 50.0 12/19/2011   HDL 49.70 12/19/2011   LDLDIRECT 167.8 02/18/2009   LDLCALC 97 12/19/2011   ALT 44 12/19/2011   AST 28 12/19/2011   NA 141 12/19/2011   K 5.4* 12/19/2011   CL 104 12/19/2011   CREATININE 1.0 12/19/2011   BUN 16 12/19/2011   CO2 26 12/19/2011   TSH 1.06 12/19/2011   PSA 0.49 12/19/2011   INR 0.97 10/07/2009   HGBA1C  Value: 6.0 (NOTE)                                                                       According to the ADA Clinical Practice Recommendations for 2011, when HbA1c is used as a screening test:   >=6.5%   Diagnostic of Diabetes Mellitus           (if abnormal result  is confirmed)  5.7-6.4%   Increased risk of developing Diabetes Mellitus  References:Diagnosis and Classification of Diabetes Mellitus,Diabetes Care,2011,34(Suppl 1):S62-S69 and Standards of Medical Care in         Diabetes - 2011,Diabetes Care,2011,34  (Suppl 1):S11-S61.* 10/08/2009

## 2013-01-20 ENCOUNTER — Other Ambulatory Visit: Payer: Self-pay | Admitting: Internal Medicine

## 2013-01-31 ENCOUNTER — Other Ambulatory Visit: Payer: Self-pay

## 2013-05-05 ENCOUNTER — Other Ambulatory Visit: Payer: Self-pay | Admitting: Internal Medicine

## 2013-07-22 ENCOUNTER — Other Ambulatory Visit: Payer: Self-pay | Admitting: Internal Medicine

## 2013-10-08 ENCOUNTER — Telehealth: Payer: Self-pay

## 2013-10-08 ENCOUNTER — Other Ambulatory Visit: Payer: Self-pay | Admitting: Internal Medicine

## 2013-10-08 DIAGNOSIS — Z0000 Encounter for general adult medical examination without abnormal findings: Secondary | ICD-10-CM

## 2013-10-08 NOTE — Telephone Encounter (Signed)
cpx labs entered  

## 2013-11-26 ENCOUNTER — Other Ambulatory Visit (INDEPENDENT_AMBULATORY_CARE_PROVIDER_SITE_OTHER): Payer: 59

## 2013-11-26 DIAGNOSIS — Z0000 Encounter for general adult medical examination without abnormal findings: Secondary | ICD-10-CM

## 2013-11-26 LAB — CBC WITH DIFFERENTIAL/PLATELET
Basophils Absolute: 0 10*3/uL (ref 0.0–0.1)
Basophils Relative: 0.7 % (ref 0.0–3.0)
Eosinophils Absolute: 0.3 10*3/uL (ref 0.0–0.7)
Eosinophils Relative: 3.8 % (ref 0.0–5.0)
HCT: 47.2 % (ref 39.0–52.0)
Hemoglobin: 16.1 g/dL (ref 13.0–17.0)
Lymphocytes Relative: 34.8 % (ref 12.0–46.0)
Lymphs Abs: 2.4 10*3/uL (ref 0.7–4.0)
MCHC: 34 g/dL (ref 30.0–36.0)
MCV: 90.2 fl (ref 78.0–100.0)
Monocytes Absolute: 0.6 10*3/uL (ref 0.1–1.0)
Monocytes Relative: 9.1 % (ref 3.0–12.0)
Neutro Abs: 3.5 10*3/uL (ref 1.4–7.7)
Neutrophils Relative %: 51.6 % (ref 43.0–77.0)
Platelets: 289 10*3/uL (ref 150.0–400.0)
RBC: 5.24 Mil/uL (ref 4.22–5.81)
RDW: 13.6 % (ref 11.5–15.5)
WBC: 6.8 10*3/uL (ref 4.0–10.5)

## 2013-11-26 LAB — URINALYSIS, ROUTINE W REFLEX MICROSCOPIC
Bilirubin Urine: NEGATIVE
Hgb urine dipstick: NEGATIVE
Ketones, ur: NEGATIVE
Leukocytes, UA: NEGATIVE
Nitrite: NEGATIVE
Specific Gravity, Urine: 1.01 (ref 1.000–1.030)
Total Protein, Urine: NEGATIVE
Urine Glucose: NEGATIVE
Urobilinogen, UA: 0.2 (ref 0.0–1.0)
WBC, UA: NONE SEEN — AB (ref 0–?)
pH: 7.5 (ref 5.0–8.0)

## 2013-11-26 LAB — BASIC METABOLIC PANEL
BUN: 17 mg/dL (ref 6–23)
CO2: 30 mEq/L (ref 19–32)
Calcium: 9.6 mg/dL (ref 8.4–10.5)
Chloride: 103 mEq/L (ref 96–112)
Creatinine, Ser: 1 mg/dL (ref 0.4–1.5)
GFR: 80.81 mL/min (ref >60.00)
Glucose, Bld: 105 mg/dL — ABNORMAL HIGH (ref 70–99)
Potassium: 4.6 mEq/L (ref 3.5–5.1)
Sodium: 138 mEq/L (ref 135–145)

## 2013-11-26 LAB — TSH: TSH: 1.76 u[IU]/mL (ref 0.35–4.50)

## 2013-11-26 LAB — LIPID PANEL
Cholesterol: 149 mg/dL (ref 0–200)
HDL: 48.8 mg/dL (ref >39.00)
LDL Cholesterol: 80 mg/dL (ref 0–99)
NonHDL: 100.2
Total CHOL/HDL Ratio: 3
Triglycerides: 103 mg/dL (ref 0.0–149.0)
VLDL: 20.6 mg/dL (ref 0.0–40.0)

## 2013-11-26 LAB — PSA: PSA: 0.65 ng/mL (ref 0.10–4.00)

## 2013-12-05 ENCOUNTER — Ambulatory Visit (INDEPENDENT_AMBULATORY_CARE_PROVIDER_SITE_OTHER): Payer: 59 | Admitting: Internal Medicine

## 2013-12-05 ENCOUNTER — Encounter: Payer: Self-pay | Admitting: Internal Medicine

## 2013-12-05 VITALS — BP 118/78 | HR 74 | Temp 98.4°F | Ht 67.0 in | Wt 218.0 lb

## 2013-12-05 DIAGNOSIS — Z0000 Encounter for general adult medical examination without abnormal findings: Secondary | ICD-10-CM

## 2013-12-05 DIAGNOSIS — Z23 Encounter for immunization: Secondary | ICD-10-CM

## 2013-12-05 MED ORDER — ASPIRIN EC 81 MG PO TBEC: 81.0000 mg | DELAYED_RELEASE_TABLET | Freq: Every day | ORAL | Status: DC

## 2013-12-05 NOTE — Patient Instructions (Addendum)
You had the flu shot today  Please start Aspirin 81 mg - 1 per day - coated only  You will be contacted regarding the referral for: colonoscopy  Please continue all other medications as before, and refills have been done if requested.  Please have the pharmacy call with any other refills you may need.  Please continue your efforts at being more active, low cholesterol diet, and weight control.  You are otherwise up to date with prevention measures today.  Please keep your appointments with your specialists as you may have planned  Please go to the LAB in the Basement (turn left off the elevator) for the tests to be done today  You will be contacted by phone if any changes need to be made immediately.  Otherwise, you will receive a letter about your results with an explanation, but please check with MyChart first.  Please remember to sign up for MyChart if you have not done so, as this will be important to you in the future with finding out test results, communicating by private email, and scheduling acute appointments online when needed.  Please return in 1 year for your yearly visit, or sooner if needed, with Lab testing done 3-5 days before

## 2013-12-05 NOTE — Progress Notes (Addendum)
Subjective:    Patient ID: Brett Griffith, male    DOB: 1960/08/24, 53 y.o.   MRN: [id number]  HPI Here for wellness and f/u;  Overall doing ok;  Pt denies CP, worsening SOB, DOE, wheezing, orthopnea, PND, worsening LE edema, palpitations, dizziness or syncope.  Pt denies neurological change such as new headache, facial or extremity weakness.  Pt denies polydipsia, polyuria, or low sugar symptoms. Pt states overall good compliance with treatment and medications, good tolerability, and has been trying to follow lower cholesterol diet.  Pt denies worsening depressive symptoms, suicidal ideation or panic. No fever, night sweats, wt loss, loss of appetite, or other constitutional symptoms.  Pt states good ability with ADL's, has low fall risk, home safety reviewed and adequate, no other significant changes in hearing or vision, and only occasionally active with exercise.  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 Past Medical History  Diagnosis Date  . ALLERGIC RHINITIS 01/08/2009  . Cellulitis and abscess of unspecified site 01/08/2009  . DEPRESSION 01/08/2009  . 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 Headache associated with sexual activity 03/02/2009  . HYPERLIPIDEMIA 01/08/2009  . NEPHROLITHIASIS, HX OF 01/08/2009  . SORE THROAT 08/22/2009  . URI 08/22/2009  . Transient diplopia 09/21/2010  . Impaired glucose tolerance 09/21/2010   Past Surgical History  Procedure Laterality Date  . S/p ganglion cyst    . S/p pilonidalcystectomy      reports that he has never smoked. He does not have any smokeless tobacco history on file. He reports that he drinks alcohol. His drug history is not on file. family history includes Heart disease in his father and mother; Hypertension in his father and mother. Allergies  Allergen Reactions  . Ezetimibe-Simvastatin     REACTION: more emotional   Current Outpatient Prescriptions on File Prior to Visit  Medication Sig  Dispense Refill  . CRESTOR 20 MG tablet TAKE ONE TABLET BY MOUTH ONE TIME DAILY   30 tablet  11  . fluticasone (FLONASE) 50 MCG/ACT nasal spray Place 2 sprays into the nose daily.  16 g  5   No current facility-administered medications on file prior to visit.    Review of Systems Constitutional: Negative for increased diaphoresis, other activity, appetite or other siginficant weight change  HENT: Negative for worsening hearing loss, ear pain, facial swelling, mouth sores and neck stiffness.   Eyes: Negative for other worsening pain, redness or visual disturbance.  Respiratory: Negative for shortness of breath and wheezing.   Cardiovascular: Negative for chest pain and palpitations.  Gastrointestinal: Negative for diarrhea, blood in stool, abdominal distention or other pain Genitourinary: Negative for hematuria, flank pain or change in urine volume.  Musculoskeletal: Negative for myalgias or other joint complaints.  Skin: Negative for color change and wound.  Neurological: Negative for syncope and numbness. other than noted Hematological: Negative for adenopathy. or other swelling Psychiatric/Behavioral: Negative for hallucinations, self-injury, decreased concentration or other worsening agitation.      Objective:   Physical Exam BP 118/78  Pulse 74  Temp(Src) 98.4 F (36.9 C) (Oral)  Ht 5\' 7"  (1.702 m)  Wt 218 lb (98.884 kg)  BMI 34.14 kg/m2  SpO2 96% VS noted,  Constitutional: Pt is oriented to person, place, and time. Appears well-developed and well-nourished.  Head: Normocephalic and atraumatic.  Right Ear: External ear normal.  Left Ear: External ear normal.  Nose: Nose normal.  Mouth/Throat: Oropharynx is clear and moist.  Eyes: Conjunctivae and  EOM are normal. Pupils are equal, round, and reactive to light.  Neck: Normal range of motion. Neck supple. No JVD present. No tracheal deviation present.  Cardiovascular: Normal rate, regular rhythm, normal heart sounds and  intact distal pulses.   Pulmonary/Chest: Effort normal and breath sounds without rales or wheezing  Abdominal: Soft. Bowel sounds are normal. NT. No HSM  Musculoskeletal: Normal range of motion. Exhibits no edema.  Lymphadenopathy:  Has no cervical adenopathy.  Neurological: Pt is alert and oriented to person, place, and time. Pt has normal reflexes. No cranial nerve deficit. Motor grossly intact Skin: Skin is warm and dry. No rash noted.  Psychiatric:  Has normal mood and affect. Behavior is normal.        Assessment & Plan:   Wt Readings from Last 3 Encounters:  12/05/13 218 lb (98.884 kg)  07/18/12 224 lb 6 oz (101.776 kg)  12/26/11 225 lb 6 oz (102.229 kg)

## 2013-12-05 NOTE — Progress Notes (Signed)
Pre visit review using our clinic review tool, if applicable. No additional management support is needed unless otherwise documented below in the visit note. 

## 2013-12-06 NOTE — Assessment & Plan Note (Signed)

## 2013-12-11 ENCOUNTER — Encounter: Payer: Self-pay | Admitting: Internal Medicine

## 2014-02-03 ENCOUNTER — Ambulatory Visit (AMBULATORY_SURGERY_CENTER): Payer: 59 | Admitting: *Deleted

## 2014-02-03 VITALS — Ht 67.0 in | Wt 224.0 lb

## 2014-02-03 DIAGNOSIS — Z1211 Encounter for screening for malignant neoplasm of colon: Secondary | ICD-10-CM

## 2014-02-03 MED ORDER — MOVIPREP 100 G PO SOLR: ORAL | Status: DC

## 2014-02-03 NOTE — Progress Notes (Signed)
Patient denies any allergies to eggs or soy. Patient denies any problems with anesthesia/sedation. Patient denies any oxygen use at home and does not take any diet/weight loss medications. EMMI education assisgned to patient on colonoscopy, this was explained and instructions given to patient. 

## 2014-02-04 ENCOUNTER — Encounter: Payer: Self-pay | Admitting: Internal Medicine

## 2014-02-17 ENCOUNTER — Ambulatory Visit (AMBULATORY_SURGERY_CENTER): Payer: BC Managed Care – PPO | Admitting: Internal Medicine

## 2014-02-17 ENCOUNTER — Encounter: Payer: Self-pay | Admitting: Internal Medicine

## 2014-02-17 VITALS — BP 120/72 | HR 66 | Temp 97.8°F | Resp 29 | Ht 67.0 in | Wt 224.0 lb

## 2014-02-17 DIAGNOSIS — D120 Benign neoplasm of cecum: Secondary | ICD-10-CM

## 2014-02-17 DIAGNOSIS — Z1211 Encounter for screening for malignant neoplasm of colon: Secondary | ICD-10-CM

## 2014-02-17 MED ORDER — SODIUM CHLORIDE 0.9 % IV SOLN
500.0000 mL | INTRAVENOUS | Status: DC
Start: 2014-02-17 — End: 2014-02-17

## 2014-02-17 NOTE — Patient Instructions (Signed)
YOU HAD AN ENDOSCOPIC PROCEDURE TODAY AT THE [facility]: Refer to the procedure report that was given to you for any specific questions about what was found during the examination.  If the procedure report does not answer your questions, please call your gastroenterologist to clarify.  If you requested that your care partner not be given the details of your procedure findings, then the procedure report has been included in a sealed envelope for you to review at your convenience later.  YOU SHOULD EXPECT: Some feelings of bloating in the abdomen. Passage of more gas than usual.  Walking can help get rid of the air that was put into your GI tract during the procedure and reduce the bloating. If you had a lower endoscopy (such as a colonoscopy or flexible sigmoidoscopy) you may notice spotting of blood in your stool or on the toilet paper. If you underwent a bowel prep for your procedure, then you may not have a normal bowel movement for a few days.  DIET: Your first meal following the procedure should be a light meal and then it is ok to progress to your normal diet.  A half-sandwich or bowl of soup is an example of a good first meal.  Heavy or fried foods are harder to digest and may make you feel nauseous or bloated.  Likewise meals heavy in dairy and vegetables can cause extra gas to form and this can also increase the bloating.  Drink plenty of fluids but you should avoid alcoholic beverages for 24 hours.  ACTIVITY: Your care partner should take you home directly after the procedure.  You should plan to take it easy, moving slowly for the rest of the day.  You can resume normal activity the day after the procedure however you should NOT DRIVE or use heavy machinery for 24 hours (because of the sedation medicines used during the test).    SYMPTOMS TO REPORT IMMEDIATELY: A gastroenterologist can be reached at any hour.  During normal business hours, 8:30 AM to 5:00 PM Monday through Friday,  call [phone].  After hours and on weekends, please call the GI answering service at [phone] who will take a message and have the physician on call contact you.   Following lower endoscopy (colonoscopy or flexible sigmoidoscopy):  Excessive amounts of blood in the stool  Significant tenderness or worsening of abdominal pains  Swelling of the abdomen that is new, acute  Fever of 100F or higher  FOLLOW UP: If any biopsies were taken you will be contacted by phone or by letter within the next 1-3 weeks.  Call your gastroenterologist if you have not heard about the biopsies in 3 weeks.  Our staff will call the home number listed on your records the next business day following your procedure to check on you and address any questions or concerns that you may have at that time regarding the information given to you following your procedure. This is a courtesy call and so if there is no answer at the home number and we have not heard from you through the emergency physician on call, we will assume that you have returned to your regular daily activities without incident.  SIGNATURES/CONFIDENTIALITY: You and/or your care partner have signed paperwork which will be entered into your electronic medical record.  These signatures attest to the fact that that the information above on your After Visit Summary has been reviewed and is understood.  Full responsibility of the confidentiality of this   discharge information lies with you and/or your care-partner.  Read your handouts given to you by your recovery room nurse.

## 2014-02-17 NOTE — Progress Notes (Signed)
A/ox3 pleased with MAC, report to Suzanne RN 

## 2014-02-17 NOTE — Progress Notes (Signed)
Called to room to assist during endoscopic procedure.  Patient ID and intended procedure confirmed with present staff. Received instructions for my participation in the procedure from the performing physician.  

## 2014-02-17 NOTE — Op Note (Signed)
[place]  [address] [place], [phone]   COLONOSCOPY PROCEDURE REPORT  PATIENT: Brett, Griffith  MR#: [id number] BIRTHDATE: 09/26/1960 , 53  yrs. old GENDER: male ENDOSCOPIST: Eustace Quail, MD REFERRED QJ:JHERD John, M.D. PROCEDURE DATE:  02/17/2014 PROCEDURE:   Colonoscopy with snare polypectomy x 2 First Screening Colonoscopy - Avg.  risk and is 50 yrs.  old or older Yes.  Prior Negative Screening - Now for repeat screening. N/A  History of Adenoma - Now for follow-up colonoscopy & has been > or = to 3 yrs.  N/A  Polyps Removed Today? Yes. ASA CLASS:   Class II INDICATIONS:average risk for colorectal cancer. MEDICATIONS: Monitored anesthesia care and Propofol 350 mg IV  DESCRIPTION OF PROCEDURE:   After the risks benefits and alternatives of the procedure were thoroughly explained, informed consent was obtained.  The digital rectal exam revealed no abnormalities of the rectum.   The LB [id number]  endoscope was introduced through the anus and advanced to the cecum, which was identified by both the appendix and ileocecal valve. No adverse events experienced.   The quality of the prep was excellent, using MoviPrep  The instrument was then slowly withdrawn as the colon was fully examined.    COLON FINDINGS: Two sessile polyps measuring 10 mm (adenomatous appearring) and 47mm (SSP type) in size were found at the cecum.  A polypectomy was performed with a cold snare.  The resection was complete, the polyp tissue was completely retrieved and sent to histology.   The examination was otherwise normal.  Retroflexed views revealed no abnormalities. The time to cecum=2 minutes 49 seconds.  Withdrawal time=11 minutes 04 seconds.  The scope was withdrawn and the procedure completed. COMPLICATIONS: There were no immediate complications.  ENDOSCOPIC IMPRESSION: 1.   Two sessile polyps measuring 8-10 mm in size were found at the cecum; polypectomy was  performed with a cold snare 2.   The examination was otherwise normal  RECOMMENDATIONS: 1. Repeat Colonoscopy in 3 years (polyp type and size).  eSigned:  Eustace Quail, MD 02/17/2014 11:34 AM   cc: Biagio Borg, MD and The Patient

## 2014-02-18 ENCOUNTER — Telehealth: Payer: Self-pay | Admitting: *Deleted

## 2014-02-18 NOTE — Telephone Encounter (Signed)
  Follow up Call-  Call back number 02/17/2014  Post procedure Call Back phone  # [phone]  Permission to leave phone message Yes     Patient questions:  Do you have a fever, pain , or abdominal swelling? No. Pain Score  0 *  Have you tolerated food without any problems? Yes.    Have you been able to return to your normal activities? Yes.    Do you have any questions about your discharge instructions: Diet   No. Medications  No. Follow up visit  No.  Do you have questions or concerns about your Care? No.  Actions: * If pain score is 4 or above: No action needed, pain <4.

## 2014-02-24 ENCOUNTER — Encounter: Payer: Self-pay | Admitting: Internal Medicine

## 2014-02-26 ENCOUNTER — Encounter: Payer: Self-pay | Admitting: Internal Medicine

## 2014-02-27 NOTE — Telephone Encounter (Signed)
To all, to see above, thanks

## 2014-10-26 ENCOUNTER — Other Ambulatory Visit: Payer: Self-pay | Admitting: Internal Medicine

## 2014-10-31 ENCOUNTER — Ambulatory Visit (INDEPENDENT_AMBULATORY_CARE_PROVIDER_SITE_OTHER): Payer: BLUE CROSS/BLUE SHIELD | Admitting: Internal Medicine

## 2014-10-31 ENCOUNTER — Encounter: Payer: Self-pay | Admitting: Internal Medicine

## 2014-10-31 VITALS — BP 136/72 | HR 69 | Temp 98.0°F | Resp 16 | Wt 211.0 lb

## 2014-10-31 DIAGNOSIS — B0229 Other postherpetic nervous system involvement: Secondary | ICD-10-CM | POA: Diagnosis not present

## 2014-10-31 DIAGNOSIS — B028 Zoster with other complications: Principal | ICD-10-CM

## 2014-10-31 MED ORDER — GABAPENTIN 100 MG PO CAPS: 100.0000 mg | ORAL_CAPSULE | Freq: Three times a day (TID) | ORAL | Status: DC

## 2014-10-31 MED ORDER — FAMCICLOVIR 500 MG PO TABS: 500.0000 mg | ORAL_TABLET | Freq: Three times a day (TID) | ORAL | Status: DC

## 2014-10-31 NOTE — Patient Instructions (Signed)
Assess response to the gabapentin one every 8 hours as needed. If it is partially beneficial, it can be increased up to a total of 3 pills every 8 hours as needed. This increase of 1 pill each dose  should take place over 72 hours at least.If 300 mg is effective dose ; there is a 300 mg pill.  Ophthalmology consultation will be requested as soon as possible. If you have severe eye pain or purulent discharge; go to the emergency room.

## 2014-10-31 NOTE — Progress Notes (Signed)
Pre visit review using our clinic review tool, if applicable. No additional management support is needed unless otherwise documented below in the visit note. 

## 2014-10-31 NOTE — Progress Notes (Signed)
   Subjective:    Patient ID: Brett Griffith, male    DOB: 06/25/1960, 54 y.o.   MRN: [id number]  HPI  His symptoms began 10/27/14 as a right occipital headache. As of 8/3 he noted a "welt" his right eyebrow. He was seeing a Nutritionist who told him she thought he had shingles.  The evening of 8/4 he noted heat and burning over the right lateral scalp. His right eye has felt heavier and he noted itching in the ear. He now describes a constant dull discomfort up to level VI-VII in this region.  He did have some fever 8/2-8/3 which has resolved.  This morning thought he had some crusting in the right eye.    Review of Systems   He denies associated blurred vision, double vision, loss of vision.  There's been no frank purulence from the eye.  He denies tinnitus or hearing loss.     Objective:   Physical Exam He has classic herpetic papular lesions in a scattered distribution in the right C-1 distribution. Extraocular motion is intact without pain. Vision is normal. No conjunctival inflammation or purulence is present. The right tympanic membrane is normal without vesicles.  General appearance:Adequately nourished; no acute distress or increased work of breathing is present.    Lymphatic: No  lymphadenopathy about the head, neck, or axilla .  Eyes: No  lid edema is present. There is no scleral icterus.  Ears:  External ear exam shows no significant lesions or deformities.  Otoscopic examination reveals clear canals, tympanic membranes are intact bilaterally without bulging, retraction, inflammation or discharge.  Nose:  External nasal examination shows no deformity or inflammation. Nasal mucosa are pink and moist without lesions or exudates No septal dislocation or deviation.No obstruction to airflow.   Oral exam: Dental hygiene is good; lips and gums are healthy appearing.There is no oropharyngeal erythema or exudate .  Neck:  No deformities, thyromegaly, masses, or tenderness  noted.   Supple with full range of motion without pain.   Heart:  Normal rate and regular rhythm. S1 and S2 normal without gallop, murmur, click, rub or other extra sounds.   Lungs:Chest clear to auscultation; no wheezes, rhonchi,rales ,or rubs present.  Extremities:  No cyanosis, edema, or clubbing  noted    Skin: Warm & dry w/o tenting or jaundice.       Assessment & Plan:  #1 C1 herpes zoster  The pathophysiology of herpes zoster infection was discussed. Restrictions were also delineated. Medications prescribed and indications discussed. See orders

## 2014-11-05 ENCOUNTER — Other Ambulatory Visit: Payer: Self-pay | Admitting: Internal Medicine

## 2014-11-06 NOTE — Telephone Encounter (Signed)
The 7 day course is the recommended Rx, not longer, for shingles

## 2014-11-06 NOTE — Telephone Encounter (Signed)
Spoke with pt, he stated that he went to Luberta Mutter, MD at [facility] 10/31/14 with a follow-up on 11/04/14. She was concerned with the shingles and his eye and told pt to call into office for a refill. Please advise on what pt should do.

## 2014-11-06 NOTE — Telephone Encounter (Signed)
He's had the complete course of medicine for the shingles

## 2014-11-06 NOTE — Telephone Encounter (Signed)
Please advise, Seen 10/31/14

## 2014-11-21 ENCOUNTER — Encounter: Payer: Self-pay | Admitting: Internal Medicine

## 2014-11-21 ENCOUNTER — Ambulatory Visit (INDEPENDENT_AMBULATORY_CARE_PROVIDER_SITE_OTHER): Payer: BLUE CROSS/BLUE SHIELD | Admitting: Internal Medicine

## 2014-11-21 VITALS — BP 124/80 | HR 70 | Temp 97.8°F | Ht 67.0 in | Wt 219.0 lb

## 2014-11-21 DIAGNOSIS — B0229 Other postherpetic nervous system involvement: Secondary | ICD-10-CM

## 2014-11-21 DIAGNOSIS — R7302 Impaired glucose tolerance (oral): Secondary | ICD-10-CM

## 2014-11-21 DIAGNOSIS — Z0000 Encounter for general adult medical examination without abnormal findings: Secondary | ICD-10-CM

## 2014-11-21 DIAGNOSIS — Z0189 Encounter for other specified special examinations: Secondary | ICD-10-CM | POA: Diagnosis not present

## 2014-11-21 DIAGNOSIS — J069 Acute upper respiratory infection, unspecified: Secondary | ICD-10-CM

## 2014-11-21 MED ORDER — GABAPENTIN 300 MG PO CAPS: 300.0000 mg | ORAL_CAPSULE | Freq: Three times a day (TID) | ORAL | Status: DC

## 2014-11-21 MED ORDER — AZITHROMYCIN 250 MG PO TABS: ORAL_TABLET | ORAL | Status: DC

## 2014-11-21 NOTE — Assessment & Plan Note (Signed)
Mild uncontrolled, shingles rash resolved, ok for increased gabapentin trial to 300 tid,  to f/u any worsening symptoms or concerns

## 2014-11-21 NOTE — Assessment & Plan Note (Signed)
stable overall by history and exam, recent data reviewed with pt, and pt to continue medical treatment as before,  to f/u any worsening symptoms or concerns Lab Results  Component Value Date   HGBA1C * 10/08/2009    6.0 (NOTE)                                                                       According to the ADA Clinical Practice Recommendations for 2011, when HbA1c is used as a screening test:   >=6.5%   Diagnostic of Diabetes Mellitus           (if abnormal result  is confirmed)  5.7-6.4%   Increased risk of developing Diabetes Mellitus  References:Diagnosis and Classification of Diabetes Mellitus,Diabetes RUEA,5409,81(XBJYN 1):S62-S69 and Standards of Medical Care in         Diabetes - 2011,Diabetes WGNF,6213,08  (Suppl 1):S11-S61.    for f/u lab next visit

## 2014-11-21 NOTE — Assessment & Plan Note (Signed)
Mild to mod, for antibx course,  to f/u any worsening symptoms or concerns 

## 2014-11-21 NOTE — Progress Notes (Signed)
Subjective:    Patient ID: Brett Griffith, male    DOB: 15-Nov-1960, 54 y.o.   MRN: [id number]  HPI  Here after episode right facial shingles about the right eye, rash resolved, finished tx, but still with ongong pain so cont's to take the gabapentin every 8 hrs, but having some breakthroughpain.  Incidentally yesterday took alka seltzer plus cold med for ? URI symtpoms.  Seems swollen to left face, and even less taste ability noted this am, left eye seems heavy., left eyelid puffy and eyelids not opening as fast on the left.  No obvious sick contacts.  Has seen optho - due for angle closure issue both eyes next week and wk after;   Pt denies polydipsia, polyuria,.     Past Medical History  Diagnosis Date  . ALLERGIC RHINITIS 01/08/2009  . Cellulitis and abscess of unspecified site 01/08/2009  . DEPRESSION 01/08/2009  . 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 Headache associated with sexual activity 03/02/2009  . HYPERLIPIDEMIA 01/08/2009  . NEPHROLITHIASIS, HX OF 01/08/2009  . SORE THROAT 08/22/2009  . URI 08/22/2009  . Transient diplopia 09/21/2010  . Impaired glucose tolerance 09/21/2010   Past Surgical History  Procedure Laterality Date  . S/p ganglion cyst    . S/p pilonidalcystectomy    . Wisdom tooth extraction      reports that he has never smoked. He has never used smokeless tobacco. He reports that he drinks about 0.6 oz of alcohol per week. He reports that he does not use illicit drugs. family history includes Heart disease in his father and mother; Hypertension in his father and mother. There is no history of Colon cancer. Allergies  Allergen Reactions  . Ezetimibe-Simvastatin     REACTION: more emotional   Current Outpatient Prescriptions on File Prior to Visit  Medication Sig Dispense Refill  . aspirin EC 81 MG tablet Take 1 tablet (81 mg total) by mouth daily. 90 tablet 11  . Coenzyme Q10 (CO Q-10 PO) Take 1 tablet by mouth daily.    . CRESTOR 20 MG tablet  TAKE ONE TABLET BY MOUTH ONE TIME DAILY 30 tablet 1  . gabapentin (NEURONTIN) 100 MG capsule Take 1 capsule (100 mg total) by mouth 3 (three) times daily. 30 capsule 1  . MAGNESIUM PO Take 1 tablet by mouth daily.    . Multiple Vitamin (MULTIVITAMIN) tablet Take 1 tablet by mouth daily.    . famciclovir (FAMVIR) 500 MG tablet Take 1 tablet (500 mg total) by mouth 3 (three) times daily. (Patient not taking: Reported on 11/21/2014) 21 tablet 0   No current facility-administered medications on file prior to visit.    Review of Systems  Constitutional: Negative for unusual diaphoresis or night sweats HENT: Negative for ringing in ear or discharge Eyes: Negative for double vision or worsening visual disturbance.  Respiratory: Negative for choking and stridor.   Gastrointestinal: Negative for vomiting or other signifcant bowel change Genitourinary: Negative for hematuria or change in urine volume.  Musculoskeletal: Negative for other MSK pain or swelling Skin: Negative for color change and worsening wound.  Neurological: Negative for tremors and numbness other than noted  Psychiatric/Behavioral: Negative for decreased concentration or agitation other than above       Objective:   Physical Exam BP 124/80 mmHg  Pulse 70  Temp(Src) 97.8 F (36.6 C) (Oral)  Ht 5\' 7"  (1.702 m)  Wt 219 lb (99.338 kg)  BMI 34.29 kg/m2  SpO2 96% VS noted,  Constitutional: Pt appears in no significant distress HENT: Head: NCAT.  Right Ear: External ear normal.  Left Ear: External ear normal.  Eyes: . Pupils are equal, round, and reactive to light. Conjunctivae and EOM are normal Bilat tm's with mild erythema.  Max sinus areas non tender but has left max sinus and periorb subtle diffuse NT swelling, no red or rash.  Pharynx with mild erythema, no exudate, uvula 1+ Neck: Normal range of motion. Neck supple.  Cardiovascular: Normal rate and regular rhythm.   Pulmonary/Chest: Effort normal and breath sounds  without rales or wheezing.  Neurological: Pt is alert. Not confused , motor grossly intact Skin: Skin is warm. No rash, no LE edema Psychiatric: Pt behavior is normal. No agitation.      Assessment & Plan:

## 2014-11-21 NOTE — Patient Instructions (Signed)
Please take all new medication as prescribed - the antibiotic  OK to increase the gabapentin to 300 mg three times daily  Please continue all other medications as before, and refills have been done if requested.  Please have the pharmacy call with any other refills you may need.  Please keep your appointments with your specialists as you may have planned  Please return in 3 months, or sooner if needed, with Lab testing done 3-5 days before

## 2014-11-24 ENCOUNTER — Encounter: Payer: Self-pay | Admitting: Internal Medicine

## 2014-11-24 DIAGNOSIS — R29810 Facial weakness: Secondary | ICD-10-CM

## 2014-11-25 ENCOUNTER — Encounter: Payer: Self-pay | Admitting: Internal Medicine

## 2014-11-25 ENCOUNTER — Ambulatory Visit (INDEPENDENT_AMBULATORY_CARE_PROVIDER_SITE_OTHER): Payer: BLUE CROSS/BLUE SHIELD | Admitting: Internal Medicine

## 2014-11-25 ENCOUNTER — Ambulatory Visit
Admission: RE | Admit: 2014-11-25 | Discharge: 2014-11-25 | Disposition: A | Discharge status: Home / Self Care | Payer: BLUE CROSS/BLUE SHIELD | Source: Ambulatory Visit | Attending: Internal Medicine | Admitting: Internal Medicine

## 2014-11-25 ENCOUNTER — Ambulatory Visit (HOSPITAL_COMMUNITY): Payer: BLUE CROSS/BLUE SHIELD

## 2014-11-25 VITALS — BP 134/80 | HR 64 | Temp 98.0°F | Ht 67.0 in | Wt 212.0 lb

## 2014-11-25 DIAGNOSIS — H02203 Unspecified lagophthalmos right eye, unspecified eyelid: Secondary | ICD-10-CM | POA: Diagnosis not present

## 2014-11-25 DIAGNOSIS — H02209 Unspecified lagophthalmos unspecified eye, unspecified eyelid: Secondary | ICD-10-CM | POA: Insufficient documentation

## 2014-11-25 DIAGNOSIS — R29810 Facial weakness: Secondary | ICD-10-CM

## 2014-11-25 DIAGNOSIS — J069 Acute upper respiratory infection, unspecified: Secondary | ICD-10-CM

## 2014-11-25 DIAGNOSIS — B0229 Other postherpetic nervous system involvement: Secondary | ICD-10-CM | POA: Diagnosis not present

## 2014-11-25 NOTE — Patient Instructions (Signed)
It appears you may have Bells Palsy today, which can be a complication of recent infection  Please continue all other medications as before, including the gabapentin, and no further antibiotics appear needed  Please have the pharmacy call with any other refills you may need.  Please keep your appointments with your specialists as you may have planned - the surgury soon, and also an Office Visit with opthomology for the right eye dryness  You will be contacted regarding the referral for: Neurology, and Head MRI (to see [facility] now)

## 2014-11-25 NOTE — Assessment & Plan Note (Signed)
Improved, controlled with increased gabapentin,  to f/u any worsening symptoms or concerns

## 2014-11-25 NOTE — Assessment & Plan Note (Signed)
Possibly related to bells palsy, for taping eye at night, visine q 4 hrs, pt plans to call optho for f/u appt, also has /fu with optho for left eye angle closure surgury in 2 days

## 2014-11-25 NOTE — Assessment & Plan Note (Signed)
Resolved, pt reassured

## 2014-11-25 NOTE — Assessment & Plan Note (Signed)
I suspect right bells palsy after recent URI, which is particularly unfortunate as it is on same side as recent shingles with some persistent PHN, cant r/o stroke or other - for MRI head, and neurology referral

## 2014-11-25 NOTE — Progress Notes (Addendum)
Subjective:    Patient ID: Brett Griffith, male    DOB: 04-04-1960, 54 y.o.   MRN: [id number]  HPI  Here to f/u, unusual c/o right eye unable to blink, facial numbness feeling, hard to spit, hard to eat as well as weakness on right lower face, speech ok , no slurred speech, no trouble swallowing, but still with intermittent sharp pains near the right ear and sensitive to touch.  No fever, no ear pain, URI symptoms resolved.  Pt denies chest pain, increased sob or doe, wheezing, orthopnea, PND, increased LE swelling, palpitations, dizziness or syncope.  Pt denies new neurological symptoms such as other new headache, or extremity weakness or numbness. Past Medical History  Diagnosis Date  . ALLERGIC RHINITIS 01/08/2009  . Cellulitis and abscess of unspecified site 01/08/2009  . DEPRESSION 01/08/2009  . 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 Headache associated with sexual activity 03/02/2009  . HYPERLIPIDEMIA 01/08/2009  . NEPHROLITHIASIS, HX OF 01/08/2009  . SORE THROAT 08/22/2009  . URI 08/22/2009  . Transient diplopia 09/21/2010  . Impaired glucose tolerance 09/21/2010   Past Surgical History  Procedure Laterality Date  . S/p ganglion cyst    . S/p pilonidalcystectomy    . Wisdom tooth extraction      reports that he has never smoked. He has never used smokeless tobacco. He reports that he drinks about 0.6 oz of alcohol per week. He reports that he does not use illicit drugs. family history includes Heart disease in his father and mother; Hypertension in his father and mother. There is no history of Colon cancer. Allergies  Allergen Reactions  . Ezetimibe-Simvastatin     REACTION: more emotional   Current Outpatient Prescriptions on File Prior to Visit  Medication Sig Dispense Refill  . aspirin EC 81 MG tablet Take 1 tablet (81 mg total) by mouth daily. 90 tablet 11  . azithromycin (ZITHROMAX Z-PAK) 250 MG tablet USE AS DIRECTED 6 tablet 1  . Coenzyme Q10 (CO Q-10 PO)  Take 1 tablet by mouth daily.    . CRESTOR 20 MG tablet TAKE ONE TABLET BY MOUTH ONE TIME DAILY 30 tablet 1  . gabapentin (NEURONTIN) 300 MG capsule Take 1 capsule (300 mg total) by mouth 3 (three) times daily. 90 capsule 5  . MAGNESIUM PO Take 1 tablet by mouth daily.    . Multiple Vitamin (MULTIVITAMIN) tablet Take 1 tablet by mouth daily.    . famciclovir (FAMVIR) 500 MG tablet Take 1 tablet (500 mg total) by mouth 3 (three) times daily. (Patient not taking: Reported on 11/25/2014) 21 tablet 0   No current facility-administered medications on file prior to visit.    Review of Systems  Constitutional: Negative for unusual diaphoresis or night sweats HENT: Negative for ringing in ear or discharge Eyes: Negative for double vision or worsening visual disturbance.  Respiratory: Negative for choking and stridor.   Gastrointestinal: Negative for vomiting or other signifcant bowel change Genitourinary: Negative for hematuria or change in urine volume.  Musculoskeletal: Negative for other MSK pain or swelling Skin: Negative for color change and worsening wound.  Neurological: Negative for tremors and numbness other than noted  Psychiatric/Behavioral: Negative for decreased concentration or agitation other than above       Objective:   Physical Exam BP 134/80 mmHg  Pulse 64  Temp(Src) 98 F (36.7 C) (Oral)  Ht 5\' 7"  (1.702 m)  Wt 212 lb (96.163 kg)  BMI 33.20 kg/m2  SpO2 96% VS noted,  not ill appearing Constitutional: Pt appears in no significant distress HENT: Head: NCAT.  Right Ear: External ear normal.  Left Ear: External ear normal.  Eyes: . Pupils are equal, round, and reactive to light. Conjunctivae and EOM are normal Neck: Normal range of motion. Neck supple.  Cardiovascular: Normal rate and regular rhythm.   Pulmonary/Chest: Effort normal and breath sounds without rales or wheezing.  Neurological: Pt is alert. Not confused , motor grossly intact throughout, cn 2-12 intact  except unable to blink right eye and right facial nerve all three branches appear with weakness due to less eyebrow movement, and right facial mouth droop noted as well Skin: Skin is warm. No rash, no LE edema Psychiatric: Pt behavior is normal. No agitation.     Assessment & Plan:

## 2014-11-27 ENCOUNTER — Telehealth: Payer: Self-pay | Admitting: *Deleted

## 2014-11-27 ENCOUNTER — Ambulatory Visit (INDEPENDENT_AMBULATORY_CARE_PROVIDER_SITE_OTHER): Payer: BLUE CROSS/BLUE SHIELD | Admitting: Neurology

## 2014-11-27 ENCOUNTER — Encounter: Payer: Self-pay | Admitting: Neurology

## 2014-11-27 VITALS — BP 116/82 | HR 74

## 2014-11-27 DIAGNOSIS — G510 Bell's palsy: Secondary | ICD-10-CM | POA: Diagnosis not present

## 2014-11-27 NOTE — Progress Notes (Signed)
[facility] [address] [place]. [place] [address] [phone]       OFFICE CONSULT NOTE  Brett Griffith Date of Birth:  09/10/1960 Medical Record Number:  [id number]   Referring MD:  Cathlean Cower  Reason for Referral:  Right face weakness  HPI: Mr Keast is a 54 year old pleasant Caucasian male who developed herpes zoster infection involving the right ophthalmic division of the trigeminal nerve one month ago. You had skin vesicles and neuralgic pain. He was seen by his primary physician and started on Famvir 500 mg 3 times daily which he completed about a week ago. He was doing fine and then developed some upper respiratory tract infection and ear infections of his primary physician and started on Z-Pak a week ago. For the last 5 days he has noted his weakness involving the right side of his face with difficulty closing his eyes and weakness of lower face. He denies any pain in his eyes or redness or dryness but does complain of intermittent sharp shooting pain behind his right ear. He has also lost taste on the right side at the back of his tongue but he denies any hyperacusis. He has no prior history of Bell's palsy. No recent tick bites. He denies any rash over the skin. He had MRI scan of the brain done on 11/25/14 which I personally reviewed and appears normal. Contrast was not administered and thin sections to the internal auditory canal were not obtained.  ROS:   14 system review of systems is positive for headache, weakness, shingles, neuralgic facial pain and all other systems negative  PMH:  Past Medical History  Diagnosis Date  . ALLERGIC RHINITIS 01/08/2009  . Cellulitis and abscess of unspecified site 01/08/2009  . DEPRESSION 01/08/2009  . 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 Headache associated with sexual activity 03/02/2009  . HYPERLIPIDEMIA 01/08/2009  . NEPHROLITHIASIS, HX OF 01/08/2009  . SORE THROAT 08/22/2009  . URI 08/22/2009  .  Transient diplopia 09/21/2010  . Impaired glucose tolerance 09/21/2010    Social History:  Social History   Social History  . Marital Status: Married    Spouse Name: N/A  . Number of Children: N/A  . Years of Education: N/A   Occupational History  .  Unemployed    active scuba diver   Social History Main Topics  . Smoking status: Never Smoker   . Smokeless tobacco: Never Used  . Alcohol Use: 0.6 oz/week    1 Cans of beer per week  . Drug Use: No  . Sexual Activity: Not on file   Other Topics Concern  . Not on file   Social History Narrative    Medications:   Current Outpatient Prescriptions on File Prior to Visit  Medication Sig Dispense Refill  . aspirin EC 81 MG tablet Take 1 tablet (81 mg total) by mouth daily. 90 tablet 11  . Coenzyme Q10 (CO Q-10 PO) Take 1 tablet by mouth daily.    . CRESTOR 20 MG tablet TAKE ONE TABLET BY MOUTH ONE TIME DAILY 30 tablet 1  . gabapentin (NEURONTIN) 300 MG capsule Take 1 capsule (300 mg total) by mouth 3 (three) times daily. 90 capsule 5  . MAGNESIUM PO Take 1 tablet by mouth daily.    . Multiple Vitamin (MULTIVITAMIN) tablet Take 1 tablet by mouth daily.     No current facility-administered medications on file prior to visit.    Allergies:   Allergies  Allergen Reactions  .  Ezetimibe-Simvastatin     REACTION: more emotional    Physical Exam General: well developed, well nourished young Caucasian male, seated, in no evident distress Head: head normocephalic and atraumatic.   Neck: supple with no carotid or supraclavicular bruits Cardiovascular: regular rate and rhythm, no murmurs Musculoskeletal: no deformity Skin:  no rash/petichiae. No visible herpetic vesicles on the right forehead face or around the eyes or ears. Vascular:  Normal pulses all extremities  Neurologic Exam Mental Status: Awake and fully alert. Oriented to place and time. Recent and remote memory intact. Attention span, concentration and fund of  knowledge appropriate. Mood and affect appropriate.  Cranial Nerves: Fundoscopic exam reveals sharp disc margins. Pupils equal, briskly reactive to light. Extraocular movements full without nystagmus. Visual fields full to confrontation. Hearing intact. Right peripheral facial nerve pattern weakness involving the forehead, eye closure, nasolabial fold. Subjective diminished taste sensation on the right side of the tongue. No hyperacusis. Facial sensation intact. Face, tongue, palate moves normally and symmetrically.  Motor: Normal bulk and tone. Normal strength in all tested extremity muscles. Sensory.: intact to touch , pinprick , position and vibratory sensation.  Coordination: Rapid alternating movements normal in all extremities. Finger-to-nose and heel-to-shin performed accurately bilaterally. Gait and Station: Arises from chair without difficulty. Stance is normal. Gait demonstrates normal stride length and balance . Able to heel, toe and tandem walk without difficulty.  Reflexes: 1+ and symmetric. Toes downgoing.       ASSESSMENT: 41 year Caucasian male with five-day history of right peripheral facial nerve weakness likely due to Bell's palsy related to recent herpes zoster infection on the face. No ear pain, vesicles or hearing loss to suggest Ramsay Hunt syndrome    PLAN: I had a long discussion with the patient regarding his right peripheral facial nerve weakness which is like to likely due to facial nerve palsy in the setting of recent herpes infection. He has completed a course of antiviral drug  for herpes zoster already and does not have significant pain behind his ear and has recent upper respiratory tract infection hence we will not give prednisone. I have instructed him to call me if his symptoms get worse. He has been instructed to use artificial tears liberally in the right eye and to use the LacriLube ointment at night to prevent infection. He was advised to follow-up with his  eye doctor if he has worsening eye symptoms. No further neurological testing is indicated at the present time. He was advised to return for follow-up in 2 months or call earlier if necessary.  Antony Contras, MD Note: This document was prepared with digital dictation and possible smart phrase technology. Any transcriptional errors that result from this process are unintentional.

## 2014-11-27 NOTE — Telephone Encounter (Signed)
[facility] - Client [facility] Patient Name: Brett Griffith Gender: Male DOB: September 19, 1960 Age: 54 Y 3 M 30 D Return Phone Number: [phone] (Primary) Address: City/State/Zip: [place] Client [facility] - Client Client Site [place] - Day Physician Cathlean Cower Contact Type Call Call Type Triage / Clinical Relationship To Patient Self Appointment Disposition EMR Appointment Not Necessary Info pasted into Epic Yes Return Phone Number [phone] (Primary) Chief Complaint NUMBNESS - sudden on one side of face or body Initial Comment caller states right side of face numbness and unresponsive PreDisposition Call Doctor Nurse Assessment Nurse: Mechele Dawley, RN, Amy Date/Time Eilene Ghazi Time): 11/24/2014 1:01:16 PM Confirm and document reason for call. If symptomatic, describe symptoms. ---RIGHT SIDE FACIAL NUMBNESS. CALLER STATES THAT HE CAME IN ON FRIDAY WITH SOME UNUSUAL SENSATIONS. HE WAS DX WITH INFECTION. GIVEN ZPACK. HE STATES THAT SATURDAY MORNING LEFT EYE WILL BLINK RIGHT WILL NOT. SMILING AND LAUGHING ONLY LEFT SIDE WILL REACT - RIGHT WILL NOT. HE WILL SPIT AND RIGHT SIDE WILL NOT ALLOW HIM TO DO SO, LOSS OF TASTE. HAD SHINGLES FIRST OF AUGUST. HE STILL HAS THE PAIN, NO BLISTERING STAGE. HE STATES THAT STARTED MEDICATIONS QUICKLY. ONLY FACE RELATED. ABOVE SHOULDERS RELATED ONLY. NO CONFUSION, DISCOMFORT. IF HE DOES NOT TAKE THE SHINGLES MEDS HE DOES HAVE PAIN. Has the patient traveled out of the country within the last 30 days? ---Not Applicable Does the patient require triage? ---Yes Related visit to physician within the last 2 weeks? ---Yes Does the PT have any chronic conditions? (i.e. diabetes, asthma, etc.) ---No Guidelines Guideline Title Affirmed Question Affirmed Notes Nurse Date/Time (Eastern Time) Neurologic Deficit Patient sounds very sick or weak to the triager [place],  [place], [place] 11/24/2014 1:02:22 PM Disp. Time Eilene Ghazi Time) Disposition Final User 11/24/2014 12:51:59 PM Send to Urgent Laroy Apple, [place] PLEASE NOTE: All timestamps contained within this report are represented as [place] Standard Time. CONFIDENTIALTY NOTICE: This fax transmission is intended only for the addressee. It contains information that is legally privileged, confidential or otherwise protected from use or disclosure. If you are not the intended recipient, you are strictly prohibited from reviewing, disclosing, copying using or disseminating any of this information or taking any action in reliance on or regarding this information. If you have received this fax in error, please notify us immediately by telephone so that we can arrange for its return to [place]. Phone: [phone], Toll-Free: [phone], Fax: [phone] Page: 2 of 2 Call Id: [id number] 11/24/2014 1:05:10 PM Go to ED Now (or PCP triage) Yes Mechele Dawley, RN, Amy Caller Understands: Yes Disagree/Comply: Comply Care Advice Given Per Guideline GO TO ED NOW (OR PCP TRIAGE): CARE ADVICE given per Neurologic Deficit (Adult) guideline. DRIVING: Another adult should drive. After Care Instructions Given Call Event Type User Date / Time Description Referrals [facility] - ED

## 2014-11-27 NOTE — Patient Instructions (Addendum)
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 He has completed a course of antiviral drug  for herpes zoster already and does not have significant pain behind his ear and has recent upper respiratory tract infection hence we will not give prednisone. I have instructed him to call me if his symptoms get worse. He has been instructed to use artificial tears liberally in the right eye and to use the LacriLube ointment at night to prevent infection. He was advised to follow-up with his eye doctor if he has worsening eye symptoms. No further neurological testing is indicated at the present time. He was advised to return for follow-up in 2 months or call earlier if necessary. Bell's Palsy Bell's palsy is a condition in which the muscles on one side of the face cannot move (paralysis). This is because the nerves in the face are paralyzed. It is most often thought to be caused by a virus. The virus causes swelling of the nerve that controls movement on one side of the face. The nerve travels through a tight space surrounded by bone. When the nerve swells, it can be compressed by the bone. This results in damage to the protective covering around the nerve. This damage interferes with how the nerve communicates with the muscles of the face. As a result, it can cause weakness or paralysis of the facial muscles.  Injury (trauma), tumor, and surgery may cause Bell's palsy, but most of the time the cause is unknown. It is a relatively common condition. It starts suddenly (abrupt onset) with the paralysis usually ending within 2 days. Bell's palsy is not dangerous. But because the eye does not close properly, you may need care to keep the eye from getting dry. This can include splinting (to keep the eye shut) or moistening with artificial tears. Bell's palsy very seldom occurs on both  sides of the face at the same time. SYMPTOMS   Eyebrow sagging.  Drooping of the eyelid and corner of the mouth.  Inability to close one eye.  Loss of taste on the front of the tongue.  Sensitivity to loud noises. TREATMENT  The treatment is usually non-surgical. If the patient is seen within the first 24 to 48 hours, a short course of steroids may be prescribed, in an attempt to shorten the length of the condition. Antiviral medicines may also be used with the steroids, but it is unclear if they are helpful.  You will need to protect your eye, if you cannot close it. The cornea (clear covering over your eye) will become dry and can be damaged. Artificial tears can be used to keep your eye moist. Glasses or an eye patch should be worn to protect your eye. PROGNOSIS  Recovery is variable, ranging from days to months. Although the problem usually goes away completely (about 80% of cases resolve), predicting the outcome is impossible. Most people improve within 3 weeks of when the symptoms began. Improvement may continue for 3 to 6 months. A small number of people have moderate to severe weakness that is permanent.  HOME CARE INSTRUCTIONS   If your caregiver prescribed medication to reduce swelling in the nerve, use as directed. Do not stop taking the medication unless directed by your caregiver.  Use moisturizing eye drops as needed to prevent drying of your eye, as directed by your caregiver.  Protect  your eye, as directed by your caregiver.  Use facial massage and exercises, as directed by your caregiver.  Perform your normal activities, and get your normal rest. SEEK IMMEDIATE MEDICAL CARE IF:   There is pain, redness or irritation in the eye.  You or your child has an oral temperature above 102 F (38.9 C), not controlled by medicine. MAKE SURE YOU:   Understand these instructions.  Will watch your condition.  Will get help right away if you are not doing well or get  worse. Document Released: 03/14/2005 Document Revised: 06/06/2011 Document Reviewed: 06/21/2013 [facility] Patient Information 2015 [place], [place]. This information is not intended to replace advice given to you by your health care provider. Make sure you discuss any questions you have with your health care provider.

## 2014-12-21 ENCOUNTER — Other Ambulatory Visit: Payer: Self-pay | Admitting: Internal Medicine

## 2014-12-25 ENCOUNTER — Encounter: Payer: BLUE CROSS/BLUE SHIELD | Admitting: Internal Medicine

## 2015-01-25 ENCOUNTER — Other Ambulatory Visit: Payer: Self-pay | Admitting: Internal Medicine

## 2015-02-17 ENCOUNTER — Ambulatory Visit: Payer: BLUE CROSS/BLUE SHIELD | Admitting: Neurology

## 2015-02-18 ENCOUNTER — Encounter: Payer: Self-pay | Admitting: Neurology

## 2015-02-23 ENCOUNTER — Other Ambulatory Visit (INDEPENDENT_AMBULATORY_CARE_PROVIDER_SITE_OTHER): Payer: BLUE CROSS/BLUE SHIELD

## 2015-02-23 DIAGNOSIS — R7302 Impaired glucose tolerance (oral): Secondary | ICD-10-CM | POA: Diagnosis not present

## 2015-02-23 DIAGNOSIS — Z0189 Encounter for other specified special examinations: Secondary | ICD-10-CM | POA: Diagnosis not present

## 2015-02-23 DIAGNOSIS — Z0000 Encounter for general adult medical examination without abnormal findings: Secondary | ICD-10-CM

## 2015-02-23 LAB — URINALYSIS, ROUTINE W REFLEX MICROSCOPIC
Bilirubin Urine: NEGATIVE
Hgb urine dipstick: NEGATIVE
Ketones, ur: NEGATIVE
Leukocytes, UA: NEGATIVE
Nitrite: NEGATIVE
RBC / HPF: NONE SEEN (ref 0–?)
Specific Gravity, Urine: 1.015 (ref 1.000–1.030)
Total Protein, Urine: NEGATIVE
Urine Glucose: NEGATIVE
Urobilinogen, UA: 0.2 (ref 0.0–1.0)
pH: 7 (ref 5.0–8.0)

## 2015-02-23 LAB — CBC WITH DIFFERENTIAL/PLATELET
Basophils Absolute: 0 10*3/uL (ref 0.0–0.1)
Basophils Relative: 0.5 % (ref 0.0–3.0)
Eosinophils Absolute: 0.2 10*3/uL (ref 0.0–0.7)
Eosinophils Relative: 2.2 % (ref 0.0–5.0)
HCT: 48.6 % (ref 39.0–52.0)
Hemoglobin: 16.3 g/dL (ref 13.0–17.0)
Lymphocytes Relative: 26.2 % (ref 12.0–46.0)
Lymphs Abs: 2.1 10*3/uL (ref 0.7–4.0)
MCHC: 33.4 g/dL (ref 30.0–36.0)
MCV: 90.7 fl (ref 78.0–100.0)
Monocytes Absolute: 0.6 10*3/uL (ref 0.1–1.0)
Monocytes Relative: 7 % (ref 3.0–12.0)
Neutro Abs: 5.2 10*3/uL (ref 1.4–7.7)
Neutrophils Relative %: 64.1 % (ref 43.0–77.0)
Platelets: 277 10*3/uL (ref 150.0–400.0)
RBC: 5.36 Mil/uL (ref 4.22–5.81)
RDW: 13.3 % (ref 11.5–15.5)
WBC: 8.1 10*3/uL (ref 4.0–10.5)

## 2015-02-23 LAB — BASIC METABOLIC PANEL
BUN: 17 mg/dL (ref 6–23)
CO2: 28 mEq/L (ref 19–32)
Calcium: 9.8 mg/dL (ref 8.4–10.5)
Chloride: 101 mEq/L (ref 96–112)
Creatinine, Ser: 1.02 mg/dL (ref 0.40–1.50)
GFR: 81.33 mL/min (ref >60.00)
Glucose, Bld: 98 mg/dL (ref 70–99)
Potassium: 4.6 mEq/L (ref 3.5–5.1)
Sodium: 139 mEq/L (ref 135–145)

## 2015-02-23 LAB — LIPID PANEL
Cholesterol: 165 mg/dL (ref 0–200)
HDL: 46.6 mg/dL (ref >39.00)
LDL Cholesterol: 83 mg/dL (ref 0–99)
NonHDL: 118.3
Total CHOL/HDL Ratio: 4
Triglycerides: 179 mg/dL — ABNORMAL HIGH (ref 0.0–149.0)
VLDL: 35.8 mg/dL (ref 0.0–40.0)

## 2015-02-23 LAB — TSH: TSH: 1.73 u[IU]/mL (ref 0.35–4.50)

## 2015-02-23 LAB — PSA: PSA: 0.52 ng/mL (ref 0.10–4.00)

## 2015-02-23 LAB — HEPATIC FUNCTION PANEL
ALT: 28 U/L (ref 0–53)
AST: 21 U/L (ref 0–37)
Albumin: 4.6 g/dL (ref 3.5–5.2)
Alkaline Phosphatase: 53 U/L (ref 39–117)
Bilirubin, Direct: 0.1 mg/dL (ref 0.0–0.3)
Total Bilirubin: 0.6 mg/dL (ref 0.2–1.2)
Total Protein: 6.8 g/dL (ref 6.0–8.3)

## 2015-02-23 LAB — HEMOGLOBIN A1C: Hgb A1c MFr Bld: 5.9 % (ref 4.6–6.5)

## 2015-02-25 ENCOUNTER — Encounter: Payer: Self-pay | Admitting: Internal Medicine

## 2015-02-25 ENCOUNTER — Ambulatory Visit (INDEPENDENT_AMBULATORY_CARE_PROVIDER_SITE_OTHER): Payer: BLUE CROSS/BLUE SHIELD | Admitting: Internal Medicine

## 2015-02-25 VITALS — BP 138/78 | HR 69 | Temp 98.1°F | Ht 67.0 in | Wt 217.2 lb

## 2015-02-25 DIAGNOSIS — Z23 Encounter for immunization: Secondary | ICD-10-CM

## 2015-02-25 DIAGNOSIS — Z8249 Family history of ischemic heart disease and other diseases of the circulatory system: Secondary | ICD-10-CM | POA: Insufficient documentation

## 2015-02-25 DIAGNOSIS — Z0000 Encounter for general adult medical examination without abnormal findings: Secondary | ICD-10-CM | POA: Diagnosis not present

## 2015-02-25 DIAGNOSIS — E785 Hyperlipidemia, unspecified: Secondary | ICD-10-CM

## 2015-02-25 DIAGNOSIS — R7302 Impaired glucose tolerance (oral): Secondary | ICD-10-CM

## 2015-02-25 MED ORDER — ROSUVASTATIN CALCIUM 20 MG PO TABS: 20.0000 mg | ORAL_TABLET | Freq: Every day | ORAL | Status: DC

## 2015-02-25 NOTE — Progress Notes (Signed)
Pre visit review using our clinic review tool, if applicable. No additional management support is needed unless otherwise documented below in the visit note. 

## 2015-02-25 NOTE — Assessment & Plan Note (Signed)
stable overall by history and exam, recent data reviewed with pt, and pt to continue medical treatment as before,  to f/u any worsening symptoms or concerns Lab Results  Component Value Date   HGBA1C 5.9 02/23/2015

## 2015-02-25 NOTE — Assessment & Plan Note (Signed)
stable overall by history and exam, recent data reviewed with pt, and pt to continue medical treatment as before,  to f/u any worsening symptoms or concerns Lab Results  Component Value Date   [place] 83 02/23/2015  ok for now, if proves to have CAD will need goal LDL < 70

## 2015-02-25 NOTE — Assessment & Plan Note (Signed)
+   FH with severe CAD early onset in 2 first degree relatives without typical symptoms; for card referral

## 2015-02-25 NOTE — Assessment & Plan Note (Signed)

## 2015-02-25 NOTE — Progress Notes (Signed)
Subjective:    Patient ID: Brett Griffith, male    DOB: 1960/07/10, 54 y.o.   MRN: [id number]  HPI  Here for wellness and f/u;  Overall doing ok;  Pt denies Chest pain, worsening SOB, DOE, wheezing, orthopnea, PND, worsening LE edema, palpitations, dizziness or syncope.  Pt denies neurological change such as new headache, facial or extremity weakness.  Pt denies polydipsia, polyuria, or low sugar symptoms. Pt states overall good compliance with treatment and medications, good tolerability, and has been trying to follow appropriate diet.  Pt denies worsening depressive symptoms, suicidal ideation or panic. No fever, night sweats, wt loss, loss of appetite, or other constitutional symptoms.  Pt states good ability with ADL's, has low fall risk, home safety reviewed and adequate, no other significant changes in hearing or vision, and only occasionally active with exercise.   Wt Readings from Last 3 Encounters:  02/25/15 217 lb 4 oz (98.544 kg)  11/25/14 212 lb (96.163 kg)  11/25/14 212 lb (96.163 kg)  Does mention father with CAD sp cabg 15 yrs ago, doing ok. Brother at 26yo with fatigue and DOE seen recently per cardiology, ended up with CABG next day after cath., LAD reportedly 100% blocked but no MI or wall motion abnormality.  Pt last stress test at 54yo - neg, but wondering about CV risk.  Uncle with no CAD, weighs 400 lbs. Past Medical History  Diagnosis Date  . ALLERGIC RHINITIS 01/08/2009  . Cellulitis and abscess of unspecified site 01/08/2009  . DEPRESSION 01/08/2009  . 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 Headache associated with sexual activity 03/02/2009  . HYPERLIPIDEMIA 01/08/2009  . NEPHROLITHIASIS, HX OF 01/08/2009  . SORE THROAT 08/22/2009  . URI 08/22/2009  . Transient diplopia 09/21/2010  . Impaired glucose tolerance 09/21/2010   Past Surgical History  Procedure Laterality Date  . S/p ganglion cyst    . S/p pilonidalcystectomy    . Wisdom tooth extraction        reports that he has never smoked. He has never used smokeless tobacco. He reports that he drinks about 0.6 oz of alcohol per week. He reports that he does not use illicit drugs. family history includes Heart disease in his father and mother; Hypertension in his brother, father, and mother. There is no history of Colon cancer. Allergies  Allergen Reactions  . Ezetimibe-Simvastatin     REACTION: more emotional   Current Outpatient Prescriptions on File Prior to Visit  Medication Sig Dispense Refill  . aspirin EC 81 MG tablet Take 1 tablet (81 mg total) by mouth daily. 90 tablet 11  . Coenzyme Q10 (CO Q-10 PO) Take 1 tablet by mouth daily.    Marland Kitchen MAGNESIUM PO Take 1 tablet by mouth daily.    . Multiple Vitamin (MULTIVITAMIN) tablet Take 1 tablet by mouth daily.     No current facility-administered medications on file prior to visit.   Review of Systems Constitutional: Negative for increased diaphoresis, other activity, appetite or siginficant weight change other than noted HENT: Negative for worsening hearing loss, ear pain, facial swelling, mouth sores and neck stiffness.   Eyes: Negative for other worsening pain, redness or visual disturbance.  Respiratory: Negative for shortness of breath and wheezing  Cardiovascular: Negative for chest pain and palpitations.  Gastrointestinal: Negative for diarrhea, blood in stool, abdominal distention or other pain Genitourinary: Negative for hematuria, flank pain or change in urine volume.  Musculoskeletal: Negative for myalgias or other joint complaints.  Skin: Negative for  color change and wound or drainage.  Neurological: Negative for syncope and numbness. other than noted Hematological: Negative for adenopathy. or other swelling Psychiatric/Behavioral: Negative for hallucinations, SI, self-injury, decreased concentration or other worsening agitation.      Objective:   Physical Exam BP 138/78 mmHg  Pulse 69  Temp(Src) 98.1 F (36.7 C)  (Oral)  Ht 5\' 7"  (1.702 m)  Wt 217 lb 4 oz (98.544 kg)  BMI 34.02 kg/m2  SpO2 96% VS noted,  Constitutional: Pt is oriented to person, place, and time. Appears well-developed and well-nourished, in no significant distress Head: Normocephalic and atraumatic.  Right Ear: External ear normal.  Left Ear: External ear normal.  Nose: Nose normal.  Mouth/Throat: Oropharynx is clear and moist.  Eyes: Conjunctivae and EOM are normal. Pupils are equal, round, and reactive to light.  Neck: Normal range of motion. Neck supple. No JVD present. No tracheal deviation present or significant neck LA or mass Cardiovascular: Normal rate, regular rhythm, normal heart sounds and intact distal pulses.   Pulmonary/Chest: Effort normal and breath sounds without rales or wheezing  Abdominal: Soft. Bowel sounds are normal. NT. No HSM  Musculoskeletal: Normal range of motion. Exhibits no edema.  Lymphadenopathy:  Has no cervical adenopathy.  Neurological: Pt is alert and oriented to person, place, and time. Pt has normal reflexes. No cranial nerve deficit. Motor grossly intact Skin: Skin is warm and dry. No rash noted.  Psychiatric:  Has normal mood and affect. Behavior is normal.     Assessment & Plan:

## 2015-02-25 NOTE — Patient Instructions (Addendum)
You had the flu shot today  Your EKG was OK today  You will be contacted regarding the referral for: cardiology  Please continue all other medications as before, and refills have been done if requested.  Please have the pharmacy call with any other refills you may need.  Please continue your efforts at being more active, low cholesterol diet, and weight control.  You are otherwise up to date with prevention measures today.  Please keep your appointments with your specialists as you may have planned  Please return in 1 year for your yearly visit, or sooner if needed, with Lab testing done 3-5 days before

## 2015-02-26 NOTE — Addendum Note (Signed)
Addended by: Biagio Borg on: 02/26/2015 04:53 AM   Modules accepted: Miquel Dunn

## 2015-03-17 ENCOUNTER — Ambulatory Visit (INDEPENDENT_AMBULATORY_CARE_PROVIDER_SITE_OTHER): Payer: BLUE CROSS/BLUE SHIELD | Admitting: Cardiology

## 2015-03-17 ENCOUNTER — Encounter: Payer: Self-pay | Admitting: Cardiology

## 2015-03-17 VITALS — BP 136/84 | HR 84 | Ht 67.0 in | Wt 216.6 lb

## 2015-03-17 DIAGNOSIS — E785 Hyperlipidemia, unspecified: Secondary | ICD-10-CM

## 2015-03-17 DIAGNOSIS — R0600 Dyspnea, unspecified: Secondary | ICD-10-CM

## 2015-03-17 DIAGNOSIS — R0602 Shortness of breath: Secondary | ICD-10-CM

## 2015-03-17 NOTE — Progress Notes (Signed)
Cardiology Office Note   Date:  03/17/2015   ID:  Brett Griffith [birth year], MRN [id number]  PCP:  Cathlean Cower, MD  Cardiologist:   Minus Breeding, MD   Chief Complaint  Patient presents with  . Shortness of Breath      History of Present Illness: Brett Griffith is a 54 y.o. male who presents for evaluation of dyspnea. He has no prior cardiac history. He does have significant cardiovascular risk factors. He had a stress test years ago because of this and it was normal. However, his younger brother recently had bypass surgery. His father had early onset coronary disease. He himself has had some dyspnea with exertion such as climbing some hills recently. He does exercise routinely and this typically doesn't cause problems. He doesn't have any chest pressure, neck or arm discomfort. He doesn't have any palpitations, presyncope or syncope. He doesn't have any PND or orthopnea.   Past Medical History  Diagnosis Date  . HYPERLIPIDEMIA 01/08/2009  . NEPHROLITHIASIS, HX OF 01/08/2009  . Impaired glucose tolerance 09/21/2010  . Shingles   . TIA (transient ischemic attack)     Past Surgical History  Procedure Laterality Date  . S/p ganglion cyst    . S/p pilonidal cystectomy    . Wisdom tooth extraction       Current Outpatient Prescriptions  Medication Sig Dispense Refill  . aspirin EC 81 MG tablet Take 1 tablet (81 mg total) by mouth daily. 90 tablet 11  . Coenzyme Q10 (CO Q-10 PO) Take 1 tablet by mouth daily.    Marland Kitchen MAGNESIUM PO Take 1 tablet by mouth daily.    . Multiple Vitamin (MULTIVITAMIN) tablet Take 1 tablet by mouth daily.    . rosuvastatin (CRESTOR) 20 MG tablet Take 1 tablet (20 mg total) by mouth daily. 90 tablet 3   No current facility-administered medications for this visit.    Allergies:   Ezetimibe-simvastatin    Social History:  The patient  reports that he has never smoked. He has never used smokeless tobacco. He reports that he drinks about 0.6 oz  of alcohol per week. He reports that he does not use illicit drugs.   Family History:  The patient's family history includes CAD (age of onset: 84) in his brother; CAD (age of onset: 75) in his father; Hypertension in his brother, father, and mother; Peripheral vascular disease (age of onset: 55) in his mother. There is no history of Colon cancer.    ROS:  Please see the history of present illness.   Otherwise, review of systems are positive for none.   All other systems are reviewed and negative.    PHYSICAL EXAM: VS:  BP 136/84 mmHg  Pulse 84  Ht 5\' 7"  (1.702 m)  Wt 216 lb 9.6 oz (98.249 kg)  BMI 33.92 kg/m2  SpO2 98% , BMI Body mass index is 33.92 kg/(m^2). GENERAL:  Well appearing HEENT:  Pupils equal round and reactive, fundi not visualized, oral mucosa unremarkable NECK:  No jugular venous distention, waveform within normal limits, carotid upstroke brisk and symmetric, no bruits, no thyromegaly LYMPHATICS:  No cervical, inguinal adenopathy LUNGS:  Clear to auscultation bilaterally BACK:  No CVA tenderness CHEST:  Unremarkable HEART:  PMI not displaced or sustained,S1 and S2 within normal limits, no S3, no S4, no clicks, no rubs, no murmurs ABD:  Flat, positive bowel sounds normal in frequency in pitch, no bruits, no rebound, no guarding, no midline pulsatile mass, no  hepatomegaly, no splenomegaly EXT:  2 plus pulses throughout, no edema, no cyanosis no clubbing SKIN:  No rashes no nodules NEURO:  Cranial nerves II through XII grossly intact, motor grossly intact throughout PSYCH:  Cognitively intact, oriented to person place and time    EKG:  EKG is not ordered today. The ekg ordered 11/30 demonstrates sinus rhythm, rate 67, axis within normal limits, intervals within normal limits, no acute ST-T wave changes.   Recent Labs: 02/23/2015: ALT 28; BUN 17; Creatinine, Ser 1.02; Hemoglobin 16.3; Platelets 277.0; Potassium 4.6; Sodium 139; TSH 1.73    Lipid Panel    Component  Value Date/Time   CHOL 165 02/23/2015 0737   TRIG 179.0* 02/23/2015 0737   HDL 46.60 02/23/2015 0737   CHOLHDL 4 02/23/2015 0737   VLDL 35.8 02/23/2015 0737   LDLCALC 83 02/23/2015 0737   LDLDIRECT 167.8 02/18/2009 0751      Wt Readings from Last 3 Encounters:  03/17/15 216 lb 9.6 oz (98.249 kg)  02/25/15 217 lb 4 oz (98.544 kg)  11/25/14 212 lb (96.163 kg)      Other studies Reviewed: Additional studies/ records that were reviewed today include: :Labs Review of the above records demonstrates:  Please see elsewhere in the note.     ASSESSMENT AND PLAN:   DYSPNEA:  The patient does have significant cardiovascular risk factors and some dyspnea with activity. I think the pretest probability of obstructive coronary disease is somewhat low. However, I would like to screen him with stress testing. I will bring the patient back for a POET (Plain Old Exercise Test). This will allow me to screen for obstructive coronary disease, risk stratify and very importantly provide a prescription for exercise.  DYSLIPIDEMIA:  He has had a markedly elevated direct LDL in the past. I like to follow-up with a repeat direct LDL.   Current medicines are reviewed at length with the patient today.  The patient does not have concerns regarding medicines.  The following changes have been made:  no change  Labs/ tests ordered today include:   Orders Placed This Encounter  Procedures  . CT Cardiac Scoring  . Direct LDL  . Exercise Tolerance Test     Disposition:   FU with me as needed.      Signed, Minus Breeding, MD  03/17/2015 5:44 PM    [place]

## 2015-03-17 NOTE — Patient Instructions (Signed)
Your physician recommends that you schedule a follow-up appointment in: As Needed  Your physician has requested that you have an exercise tolerance test. For further information please visit [email or web address]. Please also follow instruction sheet, as given.  Your physician has requested that you have a Coronary Calcium scoring Test  Your physician recommends that you return for lab work in: LDL direct

## 2015-03-24 ENCOUNTER — Other Ambulatory Visit: Payer: BLUE CROSS/BLUE SHIELD

## 2015-03-31 ENCOUNTER — Other Ambulatory Visit (INDEPENDENT_AMBULATORY_CARE_PROVIDER_SITE_OTHER): Payer: BLUE CROSS/BLUE SHIELD | Admitting: *Deleted

## 2015-03-31 ENCOUNTER — Other Ambulatory Visit: Payer: BLUE CROSS/BLUE SHIELD

## 2015-03-31 ENCOUNTER — Ambulatory Visit (INDEPENDENT_AMBULATORY_CARE_PROVIDER_SITE_OTHER)
Admission: RE | Admit: 2015-03-31 | Discharge: 2015-03-31 | Disposition: A | Discharge status: Home / Self Care | Payer: BLUE CROSS/BLUE SHIELD | Source: Ambulatory Visit | Attending: Cardiology | Admitting: Cardiology

## 2015-03-31 DIAGNOSIS — Z0000 Encounter for general adult medical examination without abnormal findings: Secondary | ICD-10-CM | POA: Diagnosis not present

## 2015-03-31 DIAGNOSIS — E785 Hyperlipidemia, unspecified: Secondary | ICD-10-CM

## 2015-03-31 DIAGNOSIS — R7302 Impaired glucose tolerance (oral): Secondary | ICD-10-CM | POA: Diagnosis not present

## 2015-03-31 DIAGNOSIS — R0602 Shortness of breath: Secondary | ICD-10-CM

## 2015-03-31 LAB — LDL CHOLESTEROL, DIRECT: Direct LDL: 123 mg/dL (ref <130)

## 2015-03-31 NOTE — Addendum Note (Signed)
Addended by: Eulis Foster on: 03/31/2015 08:38 AM   Modules accepted: Orders

## 2015-03-31 NOTE — Addendum Note (Signed)
Addended by: Eulis Foster on: 03/31/2015 08:37 AM   Modules accepted: Orders

## 2015-04-02 ENCOUNTER — Other Ambulatory Visit: Payer: Self-pay | Admitting: *Deleted

## 2015-04-02 DIAGNOSIS — E785 Hyperlipidemia, unspecified: Secondary | ICD-10-CM

## 2015-04-02 DIAGNOSIS — Z79899 Other long term (current) drug therapy: Secondary | ICD-10-CM

## 2015-04-02 DIAGNOSIS — R0600 Dyspnea, unspecified: Secondary | ICD-10-CM

## 2015-04-02 DIAGNOSIS — R9430 Abnormal result of cardiovascular function study, unspecified: Secondary | ICD-10-CM

## 2015-04-02 MED ORDER — ROSUVASTATIN CALCIUM 40 MG PO TABS: 40.0000 mg | ORAL_TABLET | Freq: Every day | ORAL | Status: DC

## 2015-04-08 ENCOUNTER — Encounter: Payer: BLUE CROSS/BLUE SHIELD | Admitting: Nurse Practitioner

## 2015-04-14 ENCOUNTER — Ambulatory Visit (HOSPITAL_COMMUNITY): Payer: BLUE CROSS/BLUE SHIELD | Attending: Cardiology

## 2015-04-14 ENCOUNTER — Ambulatory Visit (HOSPITAL_BASED_OUTPATIENT_CLINIC_OR_DEPARTMENT_OTHER): Payer: BLUE CROSS/BLUE SHIELD

## 2015-04-14 DIAGNOSIS — R9430 Abnormal result of cardiovascular function study, unspecified: Secondary | ICD-10-CM | POA: Diagnosis not present

## 2015-04-14 DIAGNOSIS — R0600 Dyspnea, unspecified: Secondary | ICD-10-CM | POA: Insufficient documentation

## 2015-04-14 DIAGNOSIS — E785 Hyperlipidemia, unspecified: Secondary | ICD-10-CM | POA: Diagnosis not present

## 2015-04-14 DIAGNOSIS — R0989 Other specified symptoms and signs involving the circulatory and respiratory systems: Secondary | ICD-10-CM

## 2015-04-15 LAB — ECHOCARDIOGRAM STRESS TEST
Estimated workload: 14.3 METS
Peak HR: 169 {beats}/min
Percent of predicted max HR: 100 %
Stage 1 DBP: 83 mmHg
Stage 1 Grade: 0 %
Stage 1 HR: 73 {beats}/min
Stage 1 SBP: 150 mmHg
Stage 1 Speed: 0 mph
Stage 10 DBP: 53 mmHg
Stage 10 Grade: 0 %
Stage 10 HR: 95 {beats}/min
Stage 10 SBP: 120 mmHg
Stage 10 Speed: 0 mph
Stage 2 DBP: 84 mmHg
Stage 2 Grade: 0 %
Stage 2 HR: 90 {beats}/min
Stage 2 SBP: 148 mmHg
Stage 2 Speed: 0 mph
Stage 3 Grade: 0 %
Stage 3 HR: 90 {beats}/min
Stage 3 Speed: 0 mph
Stage 4 Grade: 10 %
Stage 4 HR: 104 {beats}/min
Stage 4 Speed: 1.7 mph
Stage 5 DBP: 62 mmHg
Stage 5 Grade: 12 %
Stage 5 HR: 122 {beats}/min
Stage 5 SBP: 184 mmHg
Stage 5 Speed: 2.5 mph
Stage 6 DBP: 51 mmHg
Stage 6 Grade: 14 %
Stage 6 HR: 141 {beats}/min
Stage 6 SBP: 193 mmHg
Stage 6 Speed: 3.4 mph
Stage 7 DBP: 94 mmHg
Stage 7 Grade: 16 %
Stage 7 HR: 164 {beats}/min
Stage 7 SBP: 128 mmHg
Stage 7 Speed: 4.2 mph
Stage 8 Grade: 18 %
Stage 8 HR: 169 {beats}/min
Stage 8 Speed: 5 mph
Stage 9 DBP: 45 mmHg
Stage 9 Grade: 0 %
Stage 9 HR: 129 {beats}/min
Stage 9 SBP: 187 mmHg
Stage 9 Speed: 0 mph

## 2015-06-12 ENCOUNTER — Other Ambulatory Visit (INDEPENDENT_AMBULATORY_CARE_PROVIDER_SITE_OTHER): Payer: BLUE CROSS/BLUE SHIELD | Admitting: *Deleted

## 2015-06-12 DIAGNOSIS — Z79899 Other long term (current) drug therapy: Secondary | ICD-10-CM

## 2015-06-12 DIAGNOSIS — E785 Hyperlipidemia, unspecified: Secondary | ICD-10-CM

## 2015-06-12 LAB — HEPATIC FUNCTION PANEL
ALT: 28 U/L (ref 9–46)
AST: 24 U/L (ref 10–35)
Albumin: 4.8 g/dL (ref 3.6–5.1)
Alkaline Phosphatase: 54 U/L (ref 40–115)
Bilirubin, Direct: 0.2 mg/dL (ref <=0.2)
Indirect Bilirubin: 0.6 mg/dL (ref 0.2–1.2)
Total Bilirubin: 0.8 mg/dL (ref 0.2–1.2)
Total Protein: 6.8 g/dL (ref 6.1–8.1)

## 2015-06-12 LAB — LIPID PANEL
Cholesterol: 133 mg/dL (ref 125–200)
HDL: 59 mg/dL (ref >=40)
LDL Cholesterol: 60 mg/dL (ref <130)
Total CHOL/HDL Ratio: 2.3 Ratio (ref <=5.0)
Triglycerides: 70 mg/dL (ref <150)
VLDL: 14 mg/dL (ref <30)

## 2015-07-29 DIAGNOSIS — Z713 Dietary counseling and surveillance: Secondary | ICD-10-CM | POA: Diagnosis not present

## 2015-07-29 DIAGNOSIS — E669 Obesity, unspecified: Secondary | ICD-10-CM | POA: Diagnosis not present

## 2015-07-29 DIAGNOSIS — E7800 Pure hypercholesterolemia, unspecified: Secondary | ICD-10-CM | POA: Diagnosis not present

## 2015-09-01 ENCOUNTER — Encounter: Payer: Self-pay | Admitting: Internal Medicine

## 2015-09-28 DIAGNOSIS — M5126 Other intervertebral disc displacement, lumbar region: Secondary | ICD-10-CM | POA: Diagnosis not present

## 2015-09-28 DIAGNOSIS — M9903 Segmental and somatic dysfunction of lumbar region: Secondary | ICD-10-CM | POA: Diagnosis not present

## 2015-09-28 DIAGNOSIS — M5441 Lumbago with sciatica, right side: Secondary | ICD-10-CM | POA: Diagnosis not present

## 2015-09-28 DIAGNOSIS — M9905 Segmental and somatic dysfunction of pelvic region: Secondary | ICD-10-CM | POA: Diagnosis not present

## 2015-09-30 DIAGNOSIS — E7800 Pure hypercholesterolemia, unspecified: Secondary | ICD-10-CM | POA: Diagnosis not present

## 2015-09-30 DIAGNOSIS — E669 Obesity, unspecified: Secondary | ICD-10-CM | POA: Diagnosis not present

## 2015-09-30 DIAGNOSIS — Z713 Dietary counseling and surveillance: Secondary | ICD-10-CM | POA: Diagnosis not present

## 2015-10-01 DIAGNOSIS — M9905 Segmental and somatic dysfunction of pelvic region: Secondary | ICD-10-CM | POA: Diagnosis not present

## 2015-10-01 DIAGNOSIS — M5126 Other intervertebral disc displacement, lumbar region: Secondary | ICD-10-CM | POA: Diagnosis not present

## 2015-10-01 DIAGNOSIS — M5441 Lumbago with sciatica, right side: Secondary | ICD-10-CM | POA: Diagnosis not present

## 2015-10-01 DIAGNOSIS — M9903 Segmental and somatic dysfunction of lumbar region: Secondary | ICD-10-CM | POA: Diagnosis not present

## 2015-10-05 DIAGNOSIS — M5441 Lumbago with sciatica, right side: Secondary | ICD-10-CM | POA: Diagnosis not present

## 2015-10-05 DIAGNOSIS — M9905 Segmental and somatic dysfunction of pelvic region: Secondary | ICD-10-CM | POA: Diagnosis not present

## 2015-10-05 DIAGNOSIS — M9903 Segmental and somatic dysfunction of lumbar region: Secondary | ICD-10-CM | POA: Diagnosis not present

## 2015-10-05 DIAGNOSIS — M5126 Other intervertebral disc displacement, lumbar region: Secondary | ICD-10-CM | POA: Diagnosis not present

## 2015-10-09 DIAGNOSIS — M9905 Segmental and somatic dysfunction of pelvic region: Secondary | ICD-10-CM | POA: Diagnosis not present

## 2015-10-09 DIAGNOSIS — M9903 Segmental and somatic dysfunction of lumbar region: Secondary | ICD-10-CM | POA: Diagnosis not present

## 2015-10-09 DIAGNOSIS — M5126 Other intervertebral disc displacement, lumbar region: Secondary | ICD-10-CM | POA: Diagnosis not present

## 2015-10-09 DIAGNOSIS — M5441 Lumbago with sciatica, right side: Secondary | ICD-10-CM | POA: Diagnosis not present

## 2015-10-12 DIAGNOSIS — M5126 Other intervertebral disc displacement, lumbar region: Secondary | ICD-10-CM | POA: Diagnosis not present

## 2015-10-12 DIAGNOSIS — M9903 Segmental and somatic dysfunction of lumbar region: Secondary | ICD-10-CM | POA: Diagnosis not present

## 2015-10-12 DIAGNOSIS — M5441 Lumbago with sciatica, right side: Secondary | ICD-10-CM | POA: Diagnosis not present

## 2015-10-12 DIAGNOSIS — M9905 Segmental and somatic dysfunction of pelvic region: Secondary | ICD-10-CM | POA: Diagnosis not present

## 2015-10-14 DIAGNOSIS — Z0100 Encounter for examination of eyes and vision without abnormal findings: Secondary | ICD-10-CM | POA: Diagnosis not present

## 2015-10-14 DIAGNOSIS — H35362 Drusen (degenerative) of macula, left eye: Secondary | ICD-10-CM | POA: Diagnosis not present

## 2015-10-15 DIAGNOSIS — M5441 Lumbago with sciatica, right side: Secondary | ICD-10-CM | POA: Diagnosis not present

## 2015-10-15 DIAGNOSIS — M5126 Other intervertebral disc displacement, lumbar region: Secondary | ICD-10-CM | POA: Diagnosis not present

## 2015-10-15 DIAGNOSIS — M9905 Segmental and somatic dysfunction of pelvic region: Secondary | ICD-10-CM | POA: Diagnosis not present

## 2015-10-15 DIAGNOSIS — M9903 Segmental and somatic dysfunction of lumbar region: Secondary | ICD-10-CM | POA: Diagnosis not present

## 2015-10-21 DIAGNOSIS — M9905 Segmental and somatic dysfunction of pelvic region: Secondary | ICD-10-CM | POA: Diagnosis not present

## 2015-10-21 DIAGNOSIS — M9903 Segmental and somatic dysfunction of lumbar region: Secondary | ICD-10-CM | POA: Diagnosis not present

## 2015-10-21 DIAGNOSIS — M5126 Other intervertebral disc displacement, lumbar region: Secondary | ICD-10-CM | POA: Diagnosis not present

## 2015-10-21 DIAGNOSIS — M5441 Lumbago with sciatica, right side: Secondary | ICD-10-CM | POA: Diagnosis not present

## 2015-11-06 DIAGNOSIS — M5126 Other intervertebral disc displacement, lumbar region: Secondary | ICD-10-CM | POA: Diagnosis not present

## 2015-11-06 DIAGNOSIS — M9903 Segmental and somatic dysfunction of lumbar region: Secondary | ICD-10-CM | POA: Diagnosis not present

## 2015-11-06 DIAGNOSIS — M5441 Lumbago with sciatica, right side: Secondary | ICD-10-CM | POA: Diagnosis not present

## 2015-11-06 DIAGNOSIS — M9905 Segmental and somatic dysfunction of pelvic region: Secondary | ICD-10-CM | POA: Diagnosis not present

## 2015-12-02 DIAGNOSIS — E7800 Pure hypercholesterolemia, unspecified: Secondary | ICD-10-CM | POA: Diagnosis not present

## 2015-12-02 DIAGNOSIS — E669 Obesity, unspecified: Secondary | ICD-10-CM | POA: Diagnosis not present

## 2015-12-02 DIAGNOSIS — Z713 Dietary counseling and surveillance: Secondary | ICD-10-CM | POA: Diagnosis not present

## 2015-12-03 DIAGNOSIS — M5441 Lumbago with sciatica, right side: Secondary | ICD-10-CM | POA: Diagnosis not present

## 2015-12-03 DIAGNOSIS — M9905 Segmental and somatic dysfunction of pelvic region: Secondary | ICD-10-CM | POA: Diagnosis not present

## 2015-12-03 DIAGNOSIS — M5126 Other intervertebral disc displacement, lumbar region: Secondary | ICD-10-CM | POA: Diagnosis not present

## 2015-12-03 DIAGNOSIS — M9903 Segmental and somatic dysfunction of lumbar region: Secondary | ICD-10-CM | POA: Diagnosis not present

## 2015-12-30 DIAGNOSIS — Z713 Dietary counseling and surveillance: Secondary | ICD-10-CM | POA: Diagnosis not present

## 2015-12-30 DIAGNOSIS — E669 Obesity, unspecified: Secondary | ICD-10-CM | POA: Diagnosis not present

## 2015-12-30 DIAGNOSIS — E7800 Pure hypercholesterolemia, unspecified: Secondary | ICD-10-CM | POA: Diagnosis not present

## 2016-01-15 DIAGNOSIS — M9903 Segmental and somatic dysfunction of lumbar region: Secondary | ICD-10-CM | POA: Diagnosis not present

## 2016-01-15 DIAGNOSIS — M5441 Lumbago with sciatica, right side: Secondary | ICD-10-CM | POA: Diagnosis not present

## 2016-01-15 DIAGNOSIS — M9905 Segmental and somatic dysfunction of pelvic region: Secondary | ICD-10-CM | POA: Diagnosis not present

## 2016-01-15 DIAGNOSIS — M5126 Other intervertebral disc displacement, lumbar region: Secondary | ICD-10-CM | POA: Diagnosis not present

## 2016-01-21 ENCOUNTER — Other Ambulatory Visit: Payer: Self-pay

## 2016-01-21 DIAGNOSIS — D485 Neoplasm of uncertain behavior of skin: Secondary | ICD-10-CM | POA: Diagnosis not present

## 2016-01-21 DIAGNOSIS — L570 Actinic keratosis: Secondary | ICD-10-CM | POA: Diagnosis not present

## 2016-01-21 DIAGNOSIS — D239 Other benign neoplasm of skin, unspecified: Secondary | ICD-10-CM | POA: Diagnosis not present

## 2016-01-29 ENCOUNTER — Telehealth: Payer: Self-pay | Admitting: Internal Medicine

## 2016-01-29 DIAGNOSIS — M9905 Segmental and somatic dysfunction of pelvic region: Secondary | ICD-10-CM | POA: Diagnosis not present

## 2016-01-29 DIAGNOSIS — M5126 Other intervertebral disc displacement, lumbar region: Secondary | ICD-10-CM | POA: Diagnosis not present

## 2016-01-29 DIAGNOSIS — M5441 Lumbago with sciatica, right side: Secondary | ICD-10-CM | POA: Diagnosis not present

## 2016-01-29 DIAGNOSIS — M9903 Segmental and somatic dysfunction of lumbar region: Secondary | ICD-10-CM | POA: Diagnosis not present

## 2016-01-29 NOTE — Telephone Encounter (Signed)
Patient has cpe next Thursday.  He is requesting labs to be entered so he can go before appt.

## 2016-02-04 ENCOUNTER — Encounter: Payer: Self-pay | Admitting: Internal Medicine

## 2016-02-04 ENCOUNTER — Ambulatory Visit (INDEPENDENT_AMBULATORY_CARE_PROVIDER_SITE_OTHER): Payer: BLUE CROSS/BLUE SHIELD | Admitting: Internal Medicine

## 2016-02-04 VITALS — BP 140/80 | HR 90 | Temp 98.7°F | Resp 20 | Wt 213.0 lb

## 2016-02-04 DIAGNOSIS — Z23 Encounter for immunization: Secondary | ICD-10-CM | POA: Diagnosis not present

## 2016-02-04 DIAGNOSIS — R7302 Impaired glucose tolerance (oral): Secondary | ICD-10-CM | POA: Diagnosis not present

## 2016-02-04 DIAGNOSIS — Z0001 Encounter for general adult medical examination with abnormal findings: Secondary | ICD-10-CM | POA: Diagnosis not present

## 2016-02-04 DIAGNOSIS — Z1159 Encounter for screening for other viral diseases: Secondary | ICD-10-CM | POA: Diagnosis not present

## 2016-02-04 MED ORDER — ROSUVASTATIN CALCIUM 40 MG PO TABS: 40.0000 mg | ORAL_TABLET | Freq: Every day | ORAL | 3 refills | Status: DC

## 2016-02-04 NOTE — Progress Notes (Signed)
Pre visit review using our clinic review tool, if applicable. No additional management support is needed unless otherwise documented below in the visit note. 

## 2016-02-04 NOTE — Patient Instructions (Signed)

## 2016-02-04 NOTE — Progress Notes (Signed)
Subjective:    Patient ID: Brett Griffith, male    DOB: [birth year], 55 y.o.   MRN: [id number]  HPI  Here for wellness and f/u;  Overall doing ok;  Pt denies Chest pain, worsening SOB, DOE, wheezing, orthopnea, PND, worsening LE edema, palpitations, dizziness or syncope.  Pt denies neurological change such as new headache, facial or extremity weakness.  Pt denies polydipsia, polyuria, or low sugar symptoms. Pt states overall good compliance with treatment and medications, good tolerability, and has been trying to follow appropriate diet.  Pt denies worsening depressive symptoms, suicidal ideation or panic. No fever, night sweats,  loss of appetite, or other constitutional symptoms.  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  Has overall lost 23 lbs in last 2-3 yrs with better diet, more exercise., working with dietician. Wt Readings from Last 3 Encounters:  02/04/16 213 lb (96.6 kg)  03/17/15 216 lb 9.6 oz (98.2 kg)  02/25/15 217 lb 4 oz (98.5 kg)   Past Medical History:  Diagnosis Date  . HYPERLIPIDEMIA 01/08/2009  . Impaired glucose tolerance 09/21/2010  . NEPHROLITHIASIS, HX OF 01/08/2009  . Shingles   . TIA (transient ischemic attack)    Past Surgical History:  Procedure Laterality Date  . s/p ganglion cyst    . s/p pilonidal cystectomy    . WISDOM TOOTH EXTRACTION      reports that he has never smoked. He has never used smokeless tobacco. He reports that he drinks about 0.6 oz of alcohol per week . He reports that he does not use drugs. family history includes CAD (age of onset: 45) in his brother; CAD (age of onset: 15) in his father; Hypertension in his brother, father, and mother; Peripheral vascular disease (age of onset: 5) in his  mother. Allergies  Allergen Reactions  . Ezetimibe-Simvastatin     REACTION: more emotional   Current Outpatient Prescriptions on File Prior to Visit  Medication Sig Dispense Refill  . aspirin EC 81 MG tablet Take 1 tablet (81 mg total) by mouth daily. 90 tablet 11  . Coenzyme Q10 (CO Q-10 PO) Take 1 tablet by mouth daily.    Marland Kitchen MAGNESIUM PO Take 1 tablet by mouth daily.    . Multiple Vitamin (MULTIVITAMIN) tablet Take 1 tablet by mouth daily.     No current facility-administered medications on file prior to visit.    Review of Systems Constitutional: Negative for increased diaphoresis, or other activity, appetite or siginficant weight change other than noted HENT: Negative for worsening hearing loss, ear pain, facial swelling, mouth sores and neck stiffness.   Eyes: Negative for other worsening pain, redness or visual disturbance.  Respiratory: Negative for choking or stridor Cardiovascular: Negative for other chest pain and palpitations.  Gastrointestinal: Negative for worsening diarrhea, blood in stool, or abdominal distention Genitourinary: Negative for hematuria, flank pain or change in urine volume.  Musculoskeletal: Negative for myalgias or other joint complaints.  Skin: Negative for other color change and wound or drainage.  Neurological: Negative for syncope and numbness. other than noted Hematological: Negative for adenopathy. or other swelling Psychiatric/Behavioral: Negative for hallucinations, SI, self-injury, decreased concentration or other worsening agitation.  All other system neg per pt    Objective:   Physical Exam BP 140/80   Pulse 90  Temp 98.7 F (37.1 C) (Oral)   Resp 20   Wt 213 lb (96.6 kg)   SpO2 97%   BMI 33.36 kg/m  VS noted,  Constitutional: Pt is oriented to person, place, and time. Appears well-developed and well-nourished, in no significant distress Head: Normocephalic and atraumatic  Eyes: Conjunctivae and EOM are normal. Pupils are equal,  round, and reactive to light Right Ear: External ear normal.  Left Ear: External ear normal Nose: Nose normal.  Mouth/Throat: Oropharynx is clear and moist  Neck: Normal range of motion. Neck supple. No JVD present. No tracheal deviation present or significant neck LA or mass Cardiovascular: Normal rate, regular rhythm, normal heart sounds and intact distal pulses.   Pulmonary/Chest: Effort normal and breath sounds without rales or wheezing  Abdominal: Soft. Bowel sounds are normal. NT. No HSM  Musculoskeletal: Normal range of motion. Exhibits no edema Lymphadenopathy: Has no cervical adenopathy.  Neurological: Pt is alert and oriented to person, place, and time. Pt has normal reflexes. No cranial nerve deficit. Motor grossly intact Skin: Skin is warm and dry. No rash noted or new ulcers Psychiatric:  Has normal mood and affect. Behavior is normal.  No other new exam findings.    Assessment & Plan:

## 2016-02-06 NOTE — Assessment & Plan Note (Signed)

## 2016-02-06 NOTE — Assessment & Plan Note (Signed)
stable overall by history and exam, recent data reviewed with pt, and pt to continue medical treatment as before,  to f/u any worsening symptoms or concerns Lab Results  Component Value Date   LDLCALC 60 06/12/2015

## 2016-02-26 DIAGNOSIS — L570 Actinic keratosis: Secondary | ICD-10-CM | POA: Diagnosis not present

## 2016-03-02 DIAGNOSIS — E7800 Pure hypercholesterolemia, unspecified: Secondary | ICD-10-CM | POA: Diagnosis not present

## 2016-03-02 DIAGNOSIS — Z713 Dietary counseling and surveillance: Secondary | ICD-10-CM | POA: Diagnosis not present

## 2016-03-02 DIAGNOSIS — E669 Obesity, unspecified: Secondary | ICD-10-CM | POA: Diagnosis not present

## 2016-03-31 DIAGNOSIS — L570 Actinic keratosis: Secondary | ICD-10-CM | POA: Diagnosis not present

## 2016-04-10 DIAGNOSIS — M545 Low back pain: Secondary | ICD-10-CM | POA: Diagnosis not present

## 2016-04-25 DIAGNOSIS — M9904 Segmental and somatic dysfunction of sacral region: Secondary | ICD-10-CM | POA: Diagnosis not present

## 2016-04-25 DIAGNOSIS — M9903 Segmental and somatic dysfunction of lumbar region: Secondary | ICD-10-CM | POA: Diagnosis not present

## 2016-04-25 DIAGNOSIS — M9905 Segmental and somatic dysfunction of pelvic region: Secondary | ICD-10-CM | POA: Diagnosis not present

## 2016-04-25 DIAGNOSIS — M5126 Other intervertebral disc displacement, lumbar region: Secondary | ICD-10-CM | POA: Diagnosis not present

## 2016-05-02 DIAGNOSIS — M9905 Segmental and somatic dysfunction of pelvic region: Secondary | ICD-10-CM | POA: Diagnosis not present

## 2016-05-02 DIAGNOSIS — M9903 Segmental and somatic dysfunction of lumbar region: Secondary | ICD-10-CM | POA: Diagnosis not present

## 2016-05-02 DIAGNOSIS — M9904 Segmental and somatic dysfunction of sacral region: Secondary | ICD-10-CM | POA: Diagnosis not present

## 2016-05-02 DIAGNOSIS — M5126 Other intervertebral disc displacement, lumbar region: Secondary | ICD-10-CM | POA: Diagnosis not present

## 2016-05-05 DIAGNOSIS — Z713 Dietary counseling and surveillance: Secondary | ICD-10-CM | POA: Diagnosis not present

## 2016-05-05 DIAGNOSIS — E7800 Pure hypercholesterolemia, unspecified: Secondary | ICD-10-CM | POA: Diagnosis not present

## 2016-05-05 DIAGNOSIS — E669 Obesity, unspecified: Secondary | ICD-10-CM | POA: Diagnosis not present

## 2016-05-11 DIAGNOSIS — M9903 Segmental and somatic dysfunction of lumbar region: Secondary | ICD-10-CM | POA: Diagnosis not present

## 2016-05-11 DIAGNOSIS — M5126 Other intervertebral disc displacement, lumbar region: Secondary | ICD-10-CM | POA: Diagnosis not present

## 2016-05-11 DIAGNOSIS — M9905 Segmental and somatic dysfunction of pelvic region: Secondary | ICD-10-CM | POA: Diagnosis not present

## 2016-05-11 DIAGNOSIS — M9904 Segmental and somatic dysfunction of sacral region: Secondary | ICD-10-CM | POA: Diagnosis not present

## 2016-05-26 DIAGNOSIS — E7800 Pure hypercholesterolemia, unspecified: Secondary | ICD-10-CM | POA: Diagnosis not present

## 2016-05-26 DIAGNOSIS — Z713 Dietary counseling and surveillance: Secondary | ICD-10-CM | POA: Diagnosis not present

## 2016-05-26 DIAGNOSIS — E669 Obesity, unspecified: Secondary | ICD-10-CM | POA: Diagnosis not present

## 2016-06-02 DIAGNOSIS — M9903 Segmental and somatic dysfunction of lumbar region: Secondary | ICD-10-CM | POA: Diagnosis not present

## 2016-06-02 DIAGNOSIS — M9905 Segmental and somatic dysfunction of pelvic region: Secondary | ICD-10-CM | POA: Diagnosis not present

## 2016-06-02 DIAGNOSIS — M5126 Other intervertebral disc displacement, lumbar region: Secondary | ICD-10-CM | POA: Diagnosis not present

## 2016-06-02 DIAGNOSIS — M9904 Segmental and somatic dysfunction of sacral region: Secondary | ICD-10-CM | POA: Diagnosis not present

## 2016-06-06 ENCOUNTER — Encounter: Payer: Self-pay | Admitting: Internal Medicine

## 2016-06-06 DIAGNOSIS — K429 Umbilical hernia without obstruction or gangrene: Secondary | ICD-10-CM

## 2016-06-07 NOTE — Telephone Encounter (Signed)
Referral done

## 2016-06-07 NOTE — Telephone Encounter (Signed)
Pt called to check up on this, please advise  He need referral to Dr. Zella Richer at [facility] [place] [facility] for hernia. Please advise.

## 2016-06-13 DIAGNOSIS — M9905 Segmental and somatic dysfunction of pelvic region: Secondary | ICD-10-CM | POA: Diagnosis not present

## 2016-06-13 DIAGNOSIS — M9903 Segmental and somatic dysfunction of lumbar region: Secondary | ICD-10-CM | POA: Diagnosis not present

## 2016-06-13 DIAGNOSIS — M9904 Segmental and somatic dysfunction of sacral region: Secondary | ICD-10-CM | POA: Diagnosis not present

## 2016-06-13 DIAGNOSIS — M5126 Other intervertebral disc displacement, lumbar region: Secondary | ICD-10-CM | POA: Diagnosis not present

## 2016-06-29 ENCOUNTER — Encounter: Payer: Self-pay | Admitting: Surgery

## 2016-06-29 ENCOUNTER — Ambulatory Visit: Payer: Self-pay | Admitting: Surgery

## 2016-06-29 DIAGNOSIS — M9905 Segmental and somatic dysfunction of pelvic region: Secondary | ICD-10-CM | POA: Diagnosis not present

## 2016-06-29 DIAGNOSIS — K429 Umbilical hernia without obstruction or gangrene: Secondary | ICD-10-CM | POA: Diagnosis not present

## 2016-06-29 DIAGNOSIS — M9903 Segmental and somatic dysfunction of lumbar region: Secondary | ICD-10-CM | POA: Diagnosis not present

## 2016-06-29 DIAGNOSIS — M9904 Segmental and somatic dysfunction of sacral region: Secondary | ICD-10-CM | POA: Diagnosis not present

## 2016-06-29 DIAGNOSIS — M5126 Other intervertebral disc displacement, lumbar region: Secondary | ICD-10-CM | POA: Diagnosis not present

## 2016-06-29 NOTE — H&P (Signed)
  Brett Griffith 06/29/2016 3:32 PM Location: [facility] Patient #: [id number] DOB: 1960/12/17 Married / Language: English / Race: Refused to Report/Unreported Male  History of Present Illness (Neven Fina A. Kae Heller MD; 06/29/2016 4:10 PM) Patient words: This is a very nice 56 year old gentleman who presents with several years history of a umbilical hernia. It has started to bother him with sensations of pain and pressure as he has started to become more active and work out more vigorously recently. He does not have any issues of nausea or vomiting, no bloating, no change in bowel function. No history of incarceration or strangulation. No prior abdominal surgeries. He desires repair. He is not ever been a smoker. 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  The patient is a 56 year old male.   Allergies (Yasmin Izola Price, CMA; 06/29/2016 3:33 PM) Ezetimibe-Simvastatin *ANTIHYPERLIPIDEMICS*  Medication History (Yasmin Roberson, CMA; 06/29/2016 3:35 PM) Aspirin (81MG  Tablet DR, Oral) Active. CoQ10 (Oral) Specific strength unknown - Active. Magnesium (Oral) Specific strength unknown - Active. Multivitamin Adult (Oral) Active. Rosuvastatin Calcium (40MG  Tablet, Oral) Active. Medications Reconciled     Review of Systems (Cassey Hurrell A. Kae Heller MD; 06/29/2016 4:10 PM) All other systems negative  Vitals (Yasmin Roberson CMA; 06/29/2016 3:32 PM) 06/29/2016 3:32 PM Weight: 217.8 lb Height: 67in Body Surface Area: 2.1 m Body Mass Index: 34.11 kg/m  Temp.: 98.83F  Pulse: 76 (Regular)  BP: 128/82 (Sitting, Left Arm, Standard)      Physical Exam (Birda Didonato A. Kae Heller MD; 06/29/2016 4:12 PM)  General Note: Alert and oriented, well-appearing  Integumentary Note: No lesions or rashes on limited skin exam  Head and Neck Note: No mass or thyromegaly  Eye Note: Anicteric, extraocular motion  intact  ENMT Note: Moist mucous membranes, good dentition  Chest and Lung Exam Note: Unlabored respirations, symmetrical air entry  Cardiovascular Note: Regular rate and rhythm, no pedal edema  Abdomen Note: Soft, nontender nondistended. Partially reducible umbilical hernia. No mass or organomegaly. No surgical scars  Neurologic Note: Grossly intact, normal gait  Neuropsychiatric Note: Normal mood and affect, appropriate insight  Musculoskeletal Note: Strength symmetrical throughout, no deformity    Assessment & Plan (Aimar Borghi A. Kae Heller MD; 0/06/5407 8:11 PM)  UMBILICAL HERNIA (B14.7) Story: He desires repair. I discussed open repair with possible use of mesh. Discussed risks of bleeding, infection, pain, scarring, injury to intra-abdominal structures, hernia recurrence, seroma, etc. he expressed understanding and asked appropriate questions. We will plan to proceed in the coming weeks. He did inquire about laparoscopic repair and has been watching youtube videos on the subject. We discussed that surgery as well and I indicated to him that I do recommend open repair in this instance.

## 2016-06-30 DIAGNOSIS — Z713 Dietary counseling and surveillance: Secondary | ICD-10-CM | POA: Diagnosis not present

## 2016-06-30 DIAGNOSIS — E7800 Pure hypercholesterolemia, unspecified: Secondary | ICD-10-CM | POA: Diagnosis not present

## 2016-06-30 DIAGNOSIS — E669 Obesity, unspecified: Secondary | ICD-10-CM | POA: Diagnosis not present

## 2016-07-28 DIAGNOSIS — E669 Obesity, unspecified: Secondary | ICD-10-CM | POA: Diagnosis not present

## 2016-07-28 DIAGNOSIS — Z713 Dietary counseling and surveillance: Secondary | ICD-10-CM | POA: Diagnosis not present

## 2016-07-28 DIAGNOSIS — E7800 Pure hypercholesterolemia, unspecified: Secondary | ICD-10-CM | POA: Diagnosis not present

## 2016-08-03 DIAGNOSIS — M5126 Other intervertebral disc displacement, lumbar region: Secondary | ICD-10-CM | POA: Diagnosis not present

## 2016-08-03 DIAGNOSIS — M9905 Segmental and somatic dysfunction of pelvic region: Secondary | ICD-10-CM | POA: Diagnosis not present

## 2016-08-03 DIAGNOSIS — M9904 Segmental and somatic dysfunction of sacral region: Secondary | ICD-10-CM | POA: Diagnosis not present

## 2016-08-03 DIAGNOSIS — M9903 Segmental and somatic dysfunction of lumbar region: Secondary | ICD-10-CM | POA: Diagnosis not present

## 2016-08-03 IMAGING — CT CT HEART SCORING
2 series · 16 of 20 positions shown, 18 images · non-contrast
Comparison: None.

EXAM:
OVER-READ INTERPRETATION  CT CHEST

The following report is an over-read performed by radiologist Dr. MENSAGEIRO
Sjaak [REDACTED] on 03/31/2015. This over-read
does not include interpretation of cardiac or coronary anatomy or
pathology. The initial interpretation by the cardiologist is
attached.
CLINICAL DATA: Risk stratification
Coronary Calcium Score
TECHNIQUE: The patient was scanned on a Siemens Sensation 16 slice scanner.
Axial non-contrast 3mm slices were carried out through the heart.
The data set was analyzed on a dedicated work station and scored
using the Agatson method.

[Series 2: casc 3.0 i36f 2 bestdiast 64 % · axial · 0.38mm/px · z∈[-238,-154]mm · 8 of 37 slices shown, 10 images]
[im 5/37  vessel]
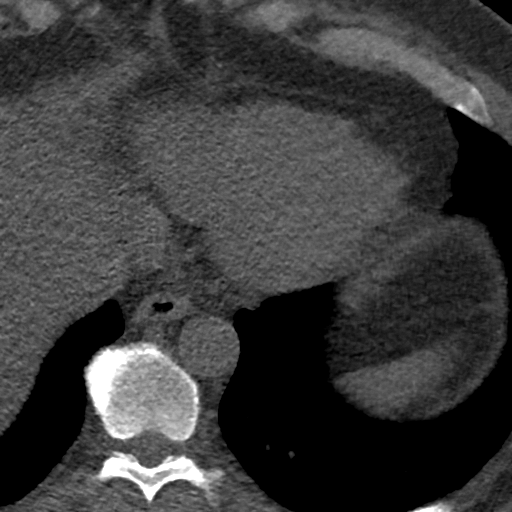
[im 5/37  lung]
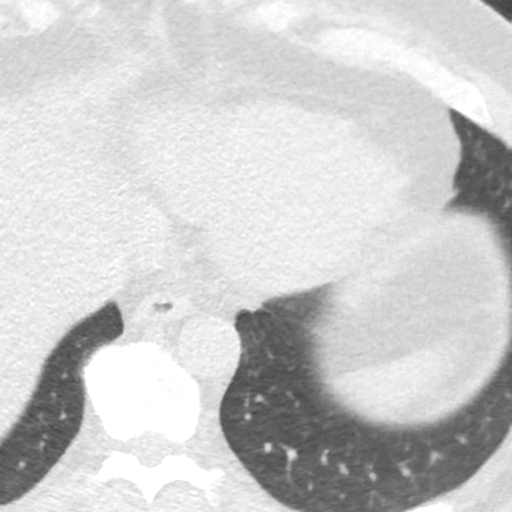
[im 9/37  vessel]
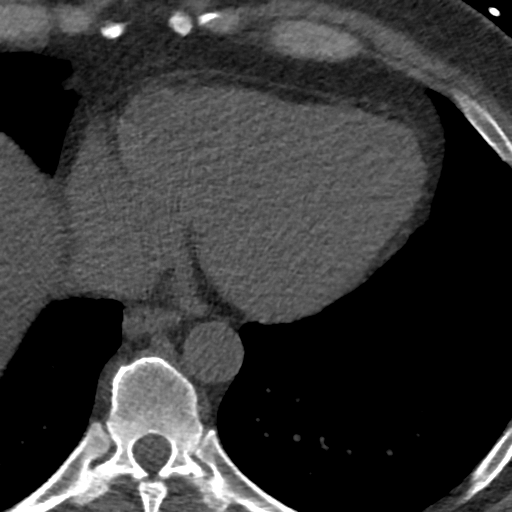
[im 13/37  vessel]
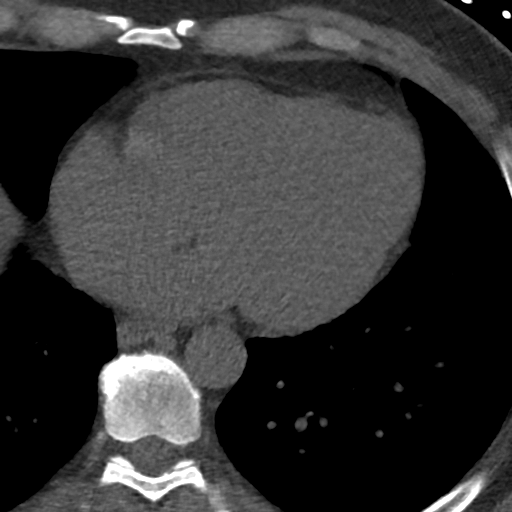
[im 17/37  vessel]
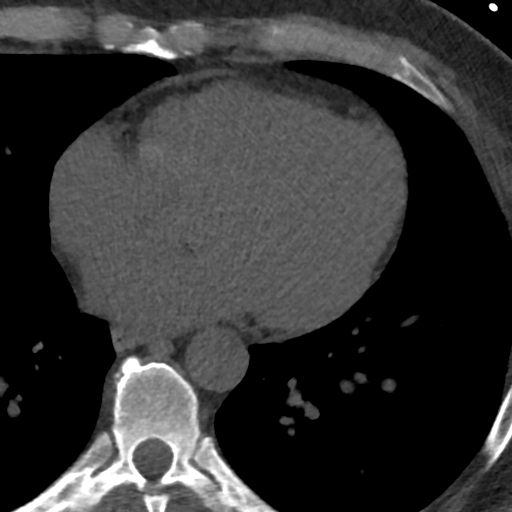
[im 21/37  vessel]
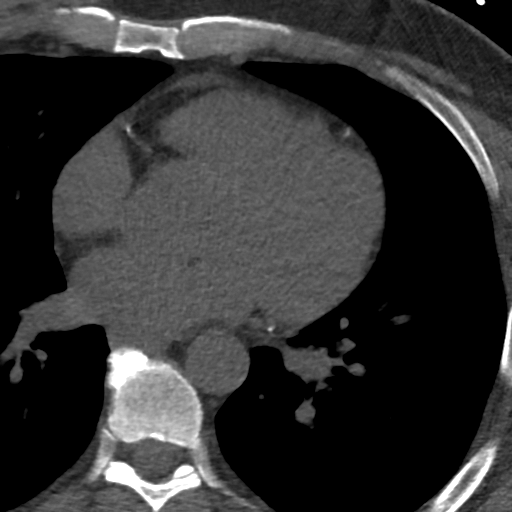
[im 21/37  lung]
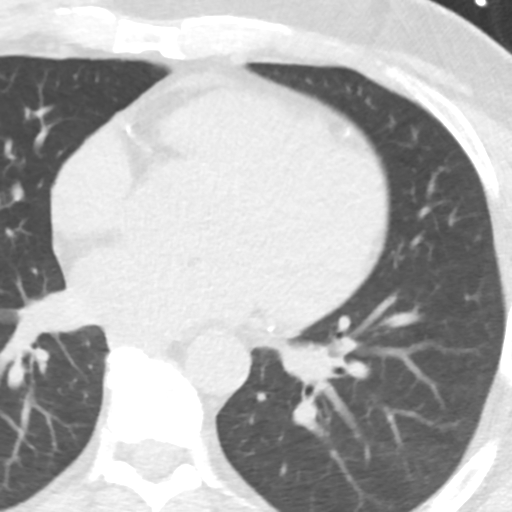
[im 25/37  vessel]
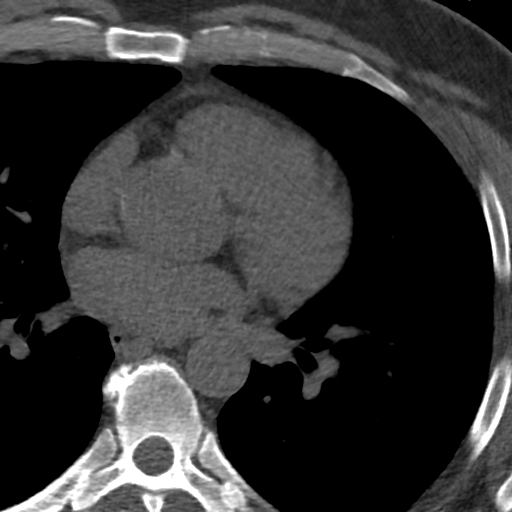
[im 29/37  vessel]
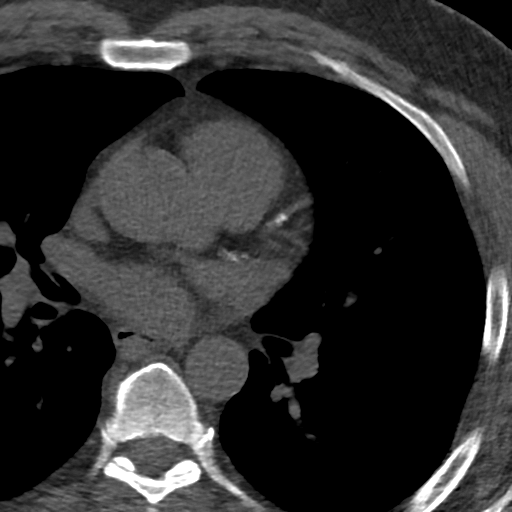
[im 33/37  vessel]
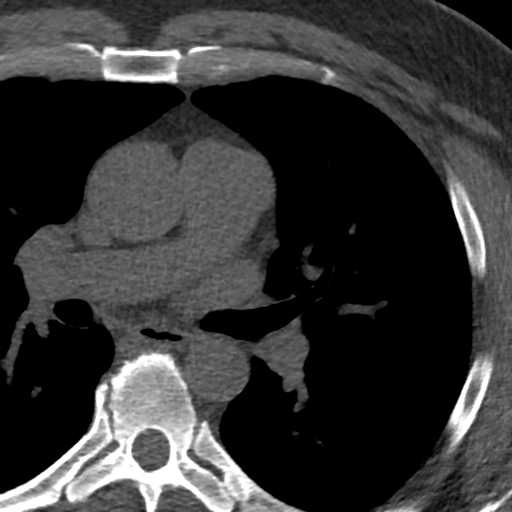

[Series 4: lung st 68 % · axial · 0.71mm/px · z∈[-238,-154]mm · 8 of 37 slices shown]
[im 5/37  lung]
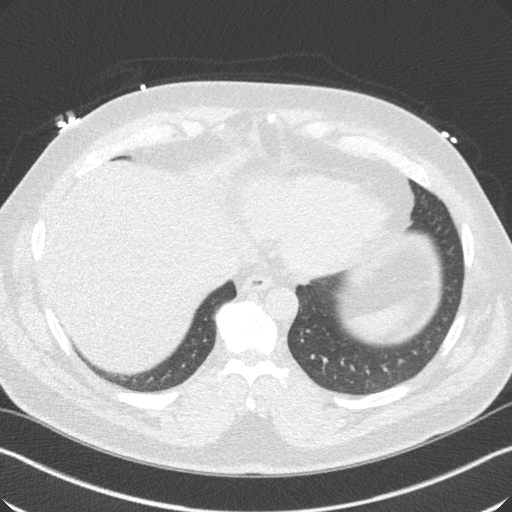
[im 9/37  lung]
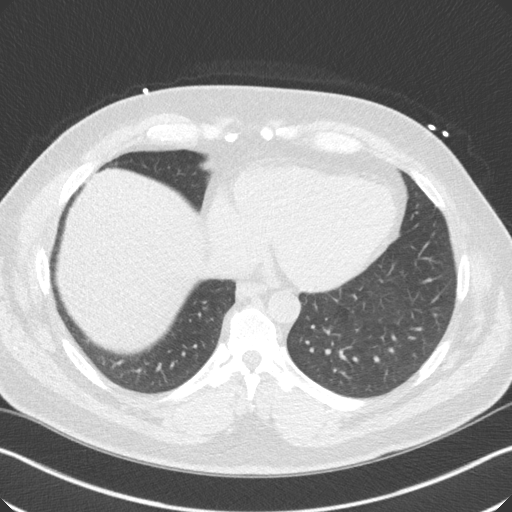
[im 13/37  lung]
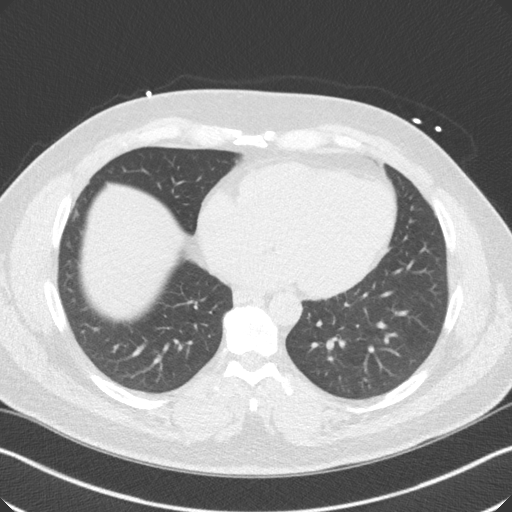
[im 17/37  lung]
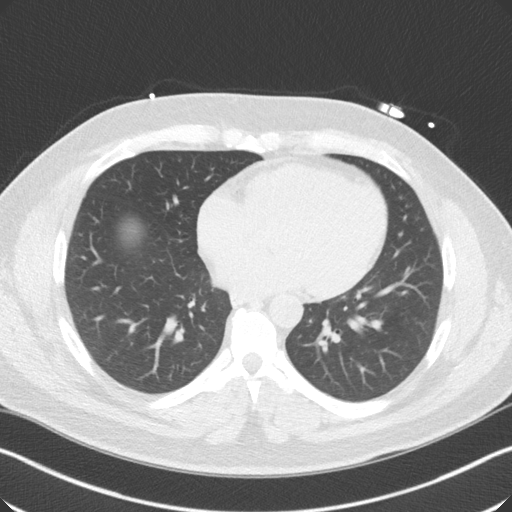
[im 21/37  lung]
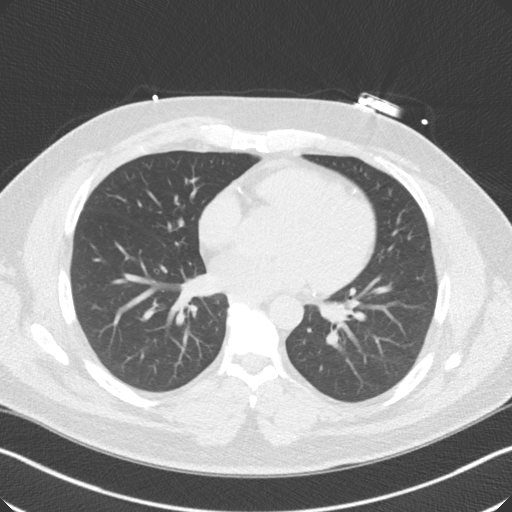
[im 25/37  lung]
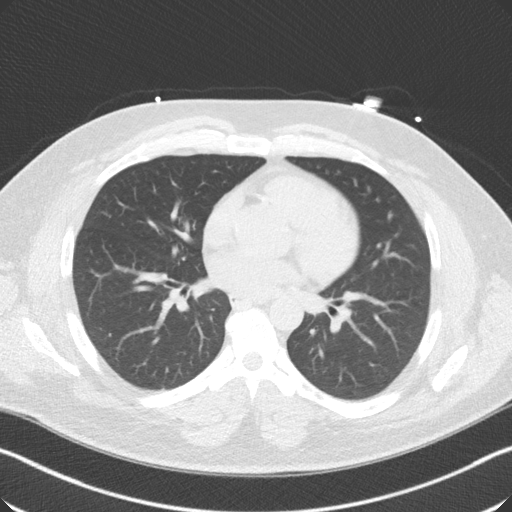
[im 29/37  lung]
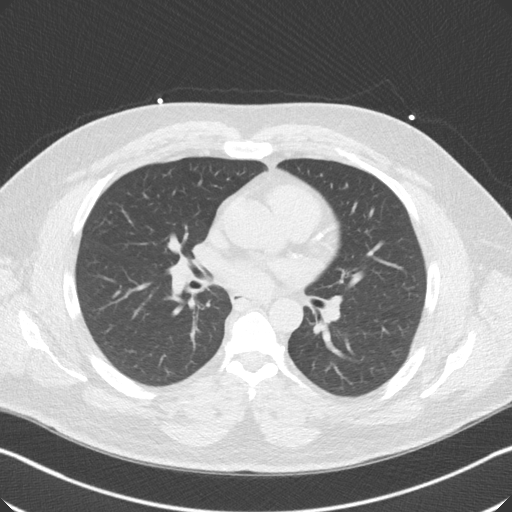
[im 33/37  lung]
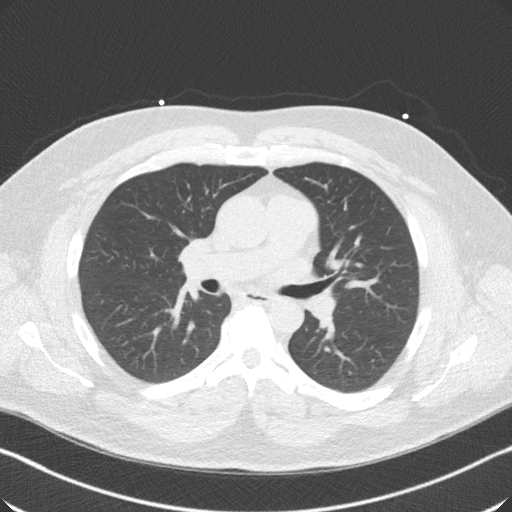

[16 of 20 positions shown; findings below may reference images not displayed]

FINDINGS: The visualized chest wall is unremarkable. No chest wall mass or
adenopathy. The bony structures are unremarkable.

No mediastinal or hilar mass or adenopathy.

The visualized lungs are clear.  No worrisome pulmonary lesions.
IMPRESSION: No significant findings.
FINDINGS: Non-cardiac: No significant non cardiac findings on limited lung and
soft tissue windows. See separate report from [REDACTED].

Ascending Aorta:  38 mm

Pericardium: Normal

Coronary arteries: Extensive calcium in LM and proximal/mid vessel
all 3 major epicardial vessels
IMPRESSION: Coronary calcium score of 468. This was 95th percentile for age and
sex matched control. Consider f/u perfusion study

Masdiana King

## 2016-08-04 ENCOUNTER — Encounter (HOSPITAL_BASED_OUTPATIENT_CLINIC_OR_DEPARTMENT_OTHER): Payer: Self-pay | Admitting: *Deleted

## 2016-08-09 DIAGNOSIS — M5126 Other intervertebral disc displacement, lumbar region: Secondary | ICD-10-CM | POA: Diagnosis not present

## 2016-08-09 DIAGNOSIS — M9904 Segmental and somatic dysfunction of sacral region: Secondary | ICD-10-CM | POA: Diagnosis not present

## 2016-08-09 DIAGNOSIS — M9903 Segmental and somatic dysfunction of lumbar region: Secondary | ICD-10-CM | POA: Diagnosis not present

## 2016-08-09 DIAGNOSIS — M9905 Segmental and somatic dysfunction of pelvic region: Secondary | ICD-10-CM | POA: Diagnosis not present

## 2016-08-10 ENCOUNTER — Ambulatory Visit (HOSPITAL_BASED_OUTPATIENT_CLINIC_OR_DEPARTMENT_OTHER)
Admission: RE | Admit: 2016-08-10 | Discharge: 2016-08-10 | Disposition: A | Discharge status: Home / Self Care | Payer: BLUE CROSS/BLUE SHIELD | Source: Ambulatory Visit | Attending: Surgery | Admitting: Surgery

## 2016-08-10 ENCOUNTER — Ambulatory Visit (HOSPITAL_BASED_OUTPATIENT_CLINIC_OR_DEPARTMENT_OTHER): Payer: BLUE CROSS/BLUE SHIELD | Admitting: Anesthesiology

## 2016-08-10 ENCOUNTER — Encounter (HOSPITAL_BASED_OUTPATIENT_CLINIC_OR_DEPARTMENT_OTHER): Payer: Self-pay | Admitting: Anesthesiology

## 2016-08-10 ENCOUNTER — Encounter (HOSPITAL_BASED_OUTPATIENT_CLINIC_OR_DEPARTMENT_OTHER)
Admission: RE | Disposition: A | Discharge status: Home / Self Care | Payer: Self-pay | Source: Ambulatory Visit | Attending: Surgery

## 2016-08-10 DIAGNOSIS — M542 Cervicalgia: Secondary | ICD-10-CM | POA: Diagnosis not present

## 2016-08-10 DIAGNOSIS — K420 Umbilical hernia with obstruction, without gangrene: Secondary | ICD-10-CM | POA: Insufficient documentation

## 2016-08-10 DIAGNOSIS — E785 Hyperlipidemia, unspecified: Secondary | ICD-10-CM | POA: Diagnosis not present

## 2016-08-10 DIAGNOSIS — F329 Major depressive disorder, single episode, unspecified: Secondary | ICD-10-CM | POA: Diagnosis not present

## 2016-08-10 HISTORY — PX: INSERTION OF MESH: SHX5868

## 2016-08-10 HISTORY — PX: UMBILICAL HERNIA REPAIR: SHX196

## 2016-08-10 HISTORY — DX: Personal history of urinary calculi: Z87.442

## 2016-08-10 SURGERY — REPAIR, HERNIA, UMBILICAL, ADULT
Anesthesia: General

## 2016-08-10 MED ORDER — CEFAZOLIN SODIUM-DEXTROSE 2-4 GM/100ML-% IV SOLN
INTRAVENOUS | Status: AC
  Filled 2016-08-10: qty 100

## 2016-08-10 MED ORDER — PROPOFOL 10 MG/ML IV BOLUS
INTRAVENOUS | Status: DC | PRN
  Administered 2016-08-10: 200 mg via INTRAVENOUS

## 2016-08-10 MED ORDER — CEFAZOLIN SODIUM-DEXTROSE 2-4 GM/100ML-% IV SOLN
2.0000 g | INTRAVENOUS | Status: AC
  Administered 2016-08-10: 2 g via INTRAVENOUS

## 2016-08-10 MED ORDER — GABAPENTIN 300 MG PO CAPS
300.0000 mg | ORAL_CAPSULE | ORAL | Status: AC
  Administered 2016-08-10: 300 mg via ORAL

## 2016-08-10 MED ORDER — CELECOXIB 400 MG PO CAPS
400.0000 mg | ORAL_CAPSULE | ORAL | Status: AC
  Administered 2016-08-10: 400 mg via ORAL

## 2016-08-10 MED ORDER — BUPIVACAINE-EPINEPHRINE 0.25% -1:200000 IJ SOLN
INTRAMUSCULAR | Status: DC | PRN
  Administered 2016-08-10: 20 mL

## 2016-08-10 MED ORDER — MIDAZOLAM HCL 2 MG/2ML IJ SOLN
1.0000 mg | INTRAMUSCULAR | Status: DC | PRN
  Administered 2016-08-10: 2 mg via INTRAVENOUS

## 2016-08-10 MED ORDER — BUPIVACAINE HCL (PF) 0.25 % IJ SOLN
INTRAMUSCULAR | Status: AC
  Filled 2016-08-10: qty 30

## 2016-08-10 MED ORDER — FENTANYL CITRATE (PF) 100 MCG/2ML IJ SOLN: 25.0000 ug | INTRAMUSCULAR | Status: DC | PRN

## 2016-08-10 MED ORDER — DEXAMETHASONE SODIUM PHOSPHATE 10 MG/ML IJ SOLN
INTRAMUSCULAR | Status: AC
  Filled 2016-08-10: qty 1

## 2016-08-10 MED ORDER — FENTANYL CITRATE (PF) 100 MCG/2ML IJ SOLN
50.0000 ug | INTRAMUSCULAR | Status: DC | PRN
  Administered 2016-08-10: 100 ug via INTRAVENOUS

## 2016-08-10 MED ORDER — ACETAMINOPHEN 500 MG PO TABS
ORAL_TABLET | ORAL | Status: AC
  Filled 2016-08-10: qty 2

## 2016-08-10 MED ORDER — LIDOCAINE HCL (CARDIAC) 20 MG/ML IV SOLN
INTRAVENOUS | Status: DC | PRN
  Administered 2016-08-10: 60 mg via INTRAVENOUS

## 2016-08-10 MED ORDER — CHLORHEXIDINE GLUCONATE 4 % EX LIQD: 60.0000 mL | Freq: Once | CUTANEOUS | Status: DC

## 2016-08-10 MED ORDER — ONDANSETRON HCL 4 MG/2ML IJ SOLN
INTRAMUSCULAR | Status: AC
  Filled 2016-08-10: qty 2

## 2016-08-10 MED ORDER — LACTATED RINGERS IV SOLN: INTRAVENOUS | Status: DC

## 2016-08-10 MED ORDER — MIDAZOLAM HCL 2 MG/2ML IJ SOLN
INTRAMUSCULAR | Status: AC
  Filled 2016-08-10: qty 2

## 2016-08-10 MED ORDER — ONDANSETRON HCL 4 MG/2ML IJ SOLN
INTRAMUSCULAR | Status: DC | PRN
  Administered 2016-08-10: 4 mg via INTRAVENOUS

## 2016-08-10 MED ORDER — BUPIVACAINE-EPINEPHRINE (PF) 0.5% -1:200000 IJ SOLN
INTRAMUSCULAR | Status: AC
  Filled 2016-08-10: qty 1.8

## 2016-08-10 MED ORDER — GABAPENTIN 300 MG PO CAPS
ORAL_CAPSULE | ORAL | Status: AC
  Filled 2016-08-10: qty 1

## 2016-08-10 MED ORDER — ACETAMINOPHEN 500 MG PO TABS
1000.0000 mg | ORAL_TABLET | ORAL | Status: AC
  Administered 2016-08-10: 1000 mg via ORAL

## 2016-08-10 MED ORDER — METOCLOPRAMIDE HCL 5 MG/ML IJ SOLN: 10.0000 mg | Freq: Once | INTRAMUSCULAR | Status: DC | PRN

## 2016-08-10 MED ORDER — KETOROLAC TROMETHAMINE 30 MG/ML IJ SOLN
INTRAMUSCULAR | Status: AC
  Filled 2016-08-10: qty 1

## 2016-08-10 MED ORDER — DOCUSATE SODIUM 100 MG PO CAPS: 100.0000 mg | ORAL_CAPSULE | Freq: Two times a day (BID) | ORAL | 0 refills | Status: AC

## 2016-08-10 MED ORDER — MEPERIDINE HCL 25 MG/ML IJ SOLN: 6.2500 mg | INTRAMUSCULAR | Status: DC | PRN

## 2016-08-10 MED ORDER — LACTATED RINGERS IV SOLN
INTRAVENOUS | Status: DC
  Administered 2016-08-10: 10:00:00 via INTRAVENOUS

## 2016-08-10 MED ORDER — FENTANYL CITRATE (PF) 100 MCG/2ML IJ SOLN
INTRAMUSCULAR | Status: AC
  Filled 2016-08-10: qty 2

## 2016-08-10 MED ORDER — DEXAMETHASONE SODIUM PHOSPHATE 4 MG/ML IJ SOLN
INTRAMUSCULAR | Status: DC | PRN
  Administered 2016-08-10: 10 mg via INTRAVENOUS

## 2016-08-10 MED ORDER — OXYCODONE-ACETAMINOPHEN 5-325 MG PO TABS: 1.0000 | ORAL_TABLET | Freq: Four times a day (QID) | ORAL | 0 refills | Status: DC | PRN

## 2016-08-10 MED ORDER — KETOROLAC TROMETHAMINE 30 MG/ML IJ SOLN
INTRAMUSCULAR | Status: DC | PRN
  Administered 2016-08-10: 30 mg via INTRAVENOUS

## 2016-08-10 MED ORDER — SCOPOLAMINE 1 MG/3DAYS TD PT72: 1.0000 | MEDICATED_PATCH | Freq: Once | TRANSDERMAL | Status: DC | PRN

## 2016-08-10 MED ORDER — GABAPENTIN 100 MG PO CAPS
ORAL_CAPSULE | ORAL | Status: AC
  Filled 2016-08-10: qty 1

## 2016-08-10 MED ORDER — CELECOXIB 200 MG PO CAPS
ORAL_CAPSULE | ORAL | Status: AC
  Filled 2016-08-10: qty 2

## 2016-08-10 MED ORDER — LIDOCAINE-EPINEPHRINE (PF) 1 %-1:200000 IJ SOLN
INTRAMUSCULAR | Status: AC
  Filled 2016-08-10: qty 30

## 2016-08-10 SURGICAL SUPPLY — 51 items
ADH SKN CLS APL DERMABOND .7 (GAUZE/BANDAGES/DRESSINGS) ×1
APL SKNCLS STERI-STRIP NONHPOA (GAUZE/BANDAGES/DRESSINGS)
BALL CTTN LRG ABS STRL LF (GAUZE/BANDAGES/DRESSINGS) ×1
BENZOIN TINCTURE PRP APPL 2/3 (GAUZE/BANDAGES/DRESSINGS) IMPLANT
BLADE CLIPPER SURG (BLADE) IMPLANT
BLADE SURG 11 STRL SS (BLADE) ×2 IMPLANT
BLADE SURG 15 STRL LF DISP TIS (BLADE) ×1 IMPLANT
BLADE SURG 15 STRL SS (BLADE) ×2
CHLORAPREP W/TINT 26ML (MISCELLANEOUS) ×2 IMPLANT
COTTONBALL LRG STERILE PKG (GAUZE/BANDAGES/DRESSINGS) ×1 IMPLANT
COVER BACK TABLE 60X90IN (DRAPES) ×2 IMPLANT
COVER MAYO STAND STRL (DRAPES) ×2 IMPLANT
DECANTER SPIKE VIAL GLASS SM (MISCELLANEOUS) IMPLANT
DERMABOND ADVANCED (GAUZE/BANDAGES/DRESSINGS) ×1
DERMABOND ADVANCED .7 DNX12 (GAUZE/BANDAGES/DRESSINGS) ×1 IMPLANT
DRAPE LAPAROTOMY 100X72 PEDS (DRAPES) ×2 IMPLANT
DRAPE UTILITY XL STRL (DRAPES) ×2 IMPLANT
DRSG TEGADERM 2-3/8X2-3/4 SM (GAUZE/BANDAGES/DRESSINGS) ×2 IMPLANT
DRSG TEGADERM 4X4.75 (GAUZE/BANDAGES/DRESSINGS) IMPLANT
ELECT COATED BLADE 2.86 ST (ELECTRODE) ×2 IMPLANT
ELECT REM PT RETURN 9FT ADLT (ELECTROSURGICAL) ×2
ELECTRODE REM PT RTRN 9FT ADLT (ELECTROSURGICAL) ×1 IMPLANT
GLOVE BIO SURGEON STRL SZ 6 (GLOVE) ×2 IMPLANT
GLOVE BIOGEL PI IND STRL 6.5 (GLOVE) ×1 IMPLANT
GLOVE BIOGEL PI IND STRL 7.0 (GLOVE) IMPLANT
GLOVE BIOGEL PI INDICATOR 6.5 (GLOVE) ×1
GLOVE BIOGEL PI INDICATOR 7.0 (GLOVE) ×2
GLOVE SURG SS PI 6.5 STRL IVOR (GLOVE) ×1 IMPLANT
GOWN STRL REUS W/ TWL LRG LVL3 (GOWN DISPOSABLE) ×2 IMPLANT
GOWN STRL REUS W/TWL LRG LVL3 (GOWN DISPOSABLE) ×4
MESH VENTRALEX ST 2.5 CRC MED (Mesh General) ×1 IMPLANT
NDL HYPO 25X1 1.5 SAFETY (NEEDLE) ×1 IMPLANT
NEEDLE HYPO 25X1 1.5 SAFETY (NEEDLE) ×2 IMPLANT
NS IRRIG 1000ML POUR BTL (IV SOLUTION) IMPLANT
PACK BASIN DAY SURGERY FS (CUSTOM PROCEDURE TRAY) ×2 IMPLANT
PENCIL BUTTON HOLSTER BLD 10FT (ELECTRODE) ×2 IMPLANT
SLEEVE SCD COMPRESS KNEE MED (MISCELLANEOUS) ×2 IMPLANT
SPONGE GAUZE 2X2 8PLY STRL LF (GAUZE/BANDAGES/DRESSINGS) ×2 IMPLANT
SPONGE LAP 4X18 X RAY DECT (DISPOSABLE) ×2 IMPLANT
STRIP CLOSURE SKIN 1/2X4 (GAUZE/BANDAGES/DRESSINGS) IMPLANT
SUT ETHIBOND 0 MO6 C/R (SUTURE) ×1 IMPLANT
SUT MNCRL AB 4-0 PS2 18 (SUTURE) ×2 IMPLANT
SUT VIC AB 0 SH 27 (SUTURE) IMPLANT
SUT VIC AB 2-0 SH 27 (SUTURE) ×2
SUT VIC AB 2-0 SH 27XBRD (SUTURE) IMPLANT
SUT VIC AB 3-0 SH 27 (SUTURE) ×2
SUT VIC AB 3-0 SH 27X BRD (SUTURE) ×1 IMPLANT
SUT VICRYL AB 3 0 TIES (SUTURE) IMPLANT
SYR CONTROL 10ML LL (SYRINGE) ×2 IMPLANT
TOWEL OR 17X24 6PK STRL BLUE (TOWEL DISPOSABLE) ×2 IMPLANT
TOWEL OR NON WOVEN STRL DISP B (DISPOSABLE) IMPLANT

## 2016-08-10 NOTE — Anesthesia Preprocedure Evaluation (Signed)
Anesthesia Evaluation  Patient identified by MRN, date of birth, ID band Patient awake    Reviewed: Allergy & Precautions, NPO status , Patient's Chart, lab work & pertinent test results  Airway Mallampati: II  TM Distance: >3 FB Neck ROM: Full    Dental no notable dental hx.    Pulmonary neg pulmonary ROS,    Pulmonary exam normal breath sounds clear to auscultation       Cardiovascular negative cardio ROS Normal cardiovascular exam Rhythm:Regular Rate:Normal     Neuro/Psych TIAnegative psych ROS   GI/Hepatic negative GI ROS, Neg liver ROS,   Endo/Other  negative endocrine ROS  Renal/GU negative Renal ROS  negative genitourinary   Musculoskeletal negative musculoskeletal ROS (+)   Abdominal   Peds negative pediatric ROS (+)  Hematology negative hematology ROS (+)   Anesthesia Other Findings   Reproductive/Obstetrics negative OB ROS                             Anesthesia Physical Anesthesia Plan  ASA: II  Anesthesia Plan: General   Post-op Pain Management:    Induction: Intravenous  Airway Management Planned: LMA and Oral ETT  Additional Equipment:   Intra-op Plan:   Post-operative Plan: Extubation in OR  Informed Consent: I have reviewed the patients History and Physical, chart, labs and discussed the procedure including the risks, benefits and alternatives for the proposed anesthesia with the patient or authorized representative who has indicated his/her understanding and acceptance.   Dental advisory given  Plan Discussed with: CRNA  Anesthesia Plan Comments:         Anesthesia Quick Evaluation

## 2016-08-10 NOTE — Discharge Instructions (Signed)
HERNIA REPAIR: POST OP INSTRUCTIONS  ######################################################################  EAT Gradually transition to a high fiber diet with a fiber supplement over the next few weeks after discharge.    WALK Walk an hour a day.  Control your pain to do that.    CONTROL PAIN Control pain so that you can walk, sleep, tolerate sneezing/coughing, go up/down stairs.  HAVE A BOWEL MOVEMENT DAILY Keep your bowels regular to avoid problems.  OK to try a laxative to override constipation.  OK to use an antidairrheal to slow down diarrhea.  Call if not better after 2 tries  CALL IF YOU HAVE PROBLEMS/CONCERNS Call if you are still struggling despite following these instructions. Call if you have concerns not answered by these instructions  ######################################################################    1. DIET: Follow a light bland diet the first 24 hours after arrival home, such as soup, liquids, crackers, etc.  Be sure to include lots of fluids daily.  Avoid fast food or heavy meals as your are more likely to get nauseated.  Eat a low fat the next few days after surgery. 2. Take your usually prescribed home medications unless otherwise directed. 3. PAIN CONTROL: a. Pain is best controlled by a usual combination of three different methods TOGETHER: i. Ice/Heat ii. Over the counter pain medication iii. Prescription pain medication b. Most patients will experience some swelling and bruising around the hernia(s) such as the bellybutton, groins, or old incisions.  Ice packs or heating pads (30-60 minutes up to 6 times a day) will help. Use ice for the first few days to help decrease swelling and bruising, then switch to heat to help relax tight/sore spots and speed recovery.  Some people prefer to use ice alone, heat alone, alternating between ice & heat.  Experiment to what works for you.  Swelling and bruising can take several weeks to resolve.   c. It is helpful to  take an over-the-counter pain medication regularly for the first few weeks.  Choose one of the following that works best for you: i. Naproxen (Aleve, etc)  Two 220mg  tabs twice a day ii. Ibuprofen (Advil, etc) Three 200mg  tabs four times a day (every meal & bedtime) iii. Acetaminophen (Tylenol, etc) 325-650mg  four times a day (every meal & bedtime) d. A  prescription for pain medication should be given to you upon discharge.  Take your pain medication as prescribed.  i. If you are having problems/concerns with the prescription medicine (does not control pain, nausea, vomiting, rash, itching, etc), please call us [phone] to see if we need to switch you to a different pain medicine that will work better for you and/or control your side effect better. ii. If you need a refill on your pain medication, please contact your pharmacy.  They will contact our office to request authorization. Prescriptions will not be filled after 5 pm or on week-ends. 4. Avoid getting constipated.  Between the surgery and the pain medications, it is common to experience some constipation.  Increasing fluid intake and taking a fiber supplement (such as Metamucil, Citrucel, FiberCon, MiraLax, etc) 1-2 times a day regularly will usually help prevent this problem from occurring.  A mild laxative (prune juice, Milk of Magnesia, MiraLax, etc) should be taken according to package directions if there are no bowel movements after 48 hours.   5. Wash / shower every day.  You may shower over the dressings as they are waterproof.   6. Remove your waterproof bandages 2 days after surgery.  The  steri strips will peel off afetr about 2 weeks. OK to remove them if they have not peeled off by then. You may leave the incision open to air.  You may replace a dressing/Band-Aid to cover the incision for comfort if you wish.  Continue to shower over incision(s) after the dressing is off.  7. ACTIVITIES as tolerated:   a. You may resume regular  (light) daily activities beginning the next day--such as daily self-care, walking, climbing stairs--gradually increasing activities as tolerated.  If you can walk 30 minutes without difficulty, it is safe to try more intense activity such as jogging, treadmill, bicycling, low-impact aerobics, swimming, etc. b. Save the most intensive and strenuous activity for last such as sit-ups, heavy lifting, contact sports, etc  Refrain from any heavy lifting or straining until you are off narcotics for pain control and 6 weeks out from surgery.   c. DO NOT PUSH THROUGH PAIN.  Let pain be your guide: If it hurts to do something, don't do it.  Pain is your body warning you to avoid that activity for another week until the pain goes down. d. You may drive when you are no longer taking prescription pain medication, you can comfortably wear a seatbelt, and you can safely maneuver your car and apply brakes. e. Dennis Bast may have sexual intercourse when it is comfortable.  8. FOLLOW UP in our office a. Please call [facility] at [phone] to set up an appointment to see your surgeon in the office for a follow-up appointment approximately 2-3 weeks after your surgery. b. Make sure that you call for this appointment the day you arrive home to insure a convenient appointment time. 9.  IF YOU HAVE DISABILITY OR FAMILY LEAVE FORMS, BRING THEM TO THE OFFICE FOR PROCESSING.  DO NOT GIVE THEM TO YOUR DOCTOR.  WHEN TO CALL us [phone]: 1. Poor pain control 2. Reactions / problems with new medications (rash/itching, nausea, etc)  3. Fever over 101.5 F (38.5 C) 4. Inability to urinate 5. Nausea and/or vomiting 6. Worsening swelling or bruising 7. Continued bleeding from incision. 8. Increased pain, redness, or drainage from the incision   The clinic staff is available to answer your questions during regular business hours (8:30am-5pm).  Please dont hesitate to call and ask to speak to one of our nurses for clinical concerns.    If you have a medical emergency, go to the nearest emergency room or call 911.  A surgeon from [facility] is always on call at the hospitals in [facility], [place], [place], [place], [place]  [address] ?  [address] [address], [place], [place]   [address] MAIN: [phone] ? TOLL FREE: [phone] ? FAX: [phone] [email or web address]    Post Anesthesia Home Care Instructions  Activity: Get plenty of rest for the remainder of the day. A responsible individual must stay with you for 24 hours following the procedure.  For the next 24 hours, DO NOT: -Drive a car -Paediatric nurse -Drink alcoholic beverages -Take any medication unless instructed by your physician -Make any legal decisions or sign important papers.  Meals: Start with liquid foods such as gelatin or soup. Progress to regular foods as tolerated. Avoid greasy, spicy, heavy foods. If nausea and/or vomiting occur, drink only clear liquids until the nausea and/or vomiting subsides. Call your physician if vomiting continues.  Special Instructions/Symptoms: Your throat may feel dry or sore from the anesthesia or the breathing tube placed  in your throat during surgery. If this causes discomfort, gargle with warm salt water. The discomfort should disappear within 24 hours.  If you had a scopolamine patch placed behind your ear for the management of post- operative nausea and/or vomiting:  1. The medication in the patch is effective for 72 hours, after which it should be removed.  Wrap patch in a tissue and discard in the trash. Wash hands thoroughly with soap and water. 2. You may remove the patch earlier than 72 hours if you experience unpleasant side effects which may include dry mouth, dizziness or visual disturbances. 3. Avoid touching the patch. Wash your hands with soap and water after contact with the patch.

## 2016-08-10 NOTE — Op Note (Signed)
Operative Note  OLUWATOMISIN DEMAN  [id number]  [id number]  08/10/2016   Surgeon: Clovis Riley  Assistant: OR staff   Procedure performed: open repair of incarcerated umbilical hernia with ventralex mesh  Preop diagnosis: incarcerated umbilical hernia Post-op diagnosis/intraop findings: incarcerated preoperitoneal fat, hernia defect about 1.2cm in diameter  Specimens: none Retained items: none EBL: minimal cc Complications: none  Description of procedure: After obtaining informed consent the patient was taken to the operating room and placed supine on operating room table wheregeneral  anesthesia with LMA was initiated, preoperative antibiotics were administered, SCDs applied, and a formal timeout was performed. The abdomen was clipped, prepped and draped in the usual sterile fashion. After infiltration with local a curvilinear incision was made just below the umbilicus. The umbilical skin was dissected off the hernia sac with cautery and blunt dissection. The sac was opened to confirm no other contents and then excised along with the chronically incarcerated preperitoneal fat. The fascia was cleared off with cautery circumferentially. The defect was about 1.2cem in diameter and the fascia somewhat attenuated. A 2.5inch ventralex patch was brought onto the field. Tacking sutures of 0 ethibond were placed superiorly, inferiorly and bilaterally between the fascia and the raised mesh ring. The mesh was then introduced and directed to lie flat circumferentially against the anterior abdominal wall, most of the mesh resides in the preperitoneal space aside from the peritoneal defect where the mesh was excised. The tacking sutures were then tied down. The fascia was closed transversely over the mesh with interrupted 0 ethibonds. Hemostasis in the wound was confirmed. The umbilical skin was tacked to the fascia with a 3-0 vicryl. The skin was closed with running subcuticular monocryl followed by  benzoin and steri strips. A pressure dressing with cotton balls and tegaderm was then applied. The patient was then awakened, extubated and taken to PACU in stable condition.   All counts were correct at the completion of the case.

## 2016-08-10 NOTE — Transfer of Care (Signed)
Immediate Anesthesia Transfer of Care Note  Patient: Brett Griffith  Procedure(s) Performed: Procedure(s): UMBILICAL HERNIA REPAIR (N/A) INSERTION OF MESH (N/A)  Patient Location: PACU  Anesthesia Type:General  Level of Consciousness: sedated  Airway & Oxygen Therapy: Patient Spontanous Breathing and Patient connected to face mask oxygen  Post-op Assessment: Report given to RN and Post -op Vital signs reviewed and stable  Post vital signs: Reviewed and stable  Last Vitals:  Vitals:   08/10/16 0926  BP: 136/78  Pulse: 70  Resp: 18  Temp: 36.8 C    Last Pain:  Vitals:   08/10/16 0926  TempSrc: Oral         Complications: No apparent anesthesia complications

## 2016-08-10 NOTE — Anesthesia Postprocedure Evaluation (Signed)
Anesthesia Post Note  Patient: Brett Griffith  Procedure(s) Performed: Procedure(s) (LRB): UMBILICAL HERNIA REPAIR (N/A) INSERTION OF MESH (N/A)  Patient location during evaluation: PACU Anesthesia Type: General Level of consciousness: awake and alert Pain management: pain level controlled Vital Signs Assessment: post-procedure vital signs reviewed and stable Respiratory status: spontaneous breathing, nonlabored ventilation, respiratory function stable and patient connected to nasal cannula oxygen Cardiovascular status: blood pressure returned to baseline and stable Postop Assessment: no signs of nausea or vomiting Anesthetic complications: no       Last Vitals:  Vitals:   08/10/16 1215 08/10/16 1230  BP: 116/73 123/74  Pulse: 72 78  Resp: 12 18  Temp:      Last Pain:  Vitals:   08/10/16 1230  TempSrc:   PainSc: 0-No pain                 Montez Hageman

## 2016-08-10 NOTE — Anesthesia Procedure Notes (Signed)
Procedure Name: LMA Insertion Date/Time: 08/10/2016 10:50 AM Performed by: Maryella Shivers Pre-anesthesia Checklist: Patient identified, Emergency Drugs available, Suction available and Patient being monitored Patient Re-evaluated:Patient Re-evaluated prior to inductionOxygen Delivery Method: Circle system utilized Preoxygenation: Pre-oxygenation with 100% oxygen Intubation Type: IV induction Ventilation: Mask ventilation without difficulty LMA: LMA inserted LMA Size: 5.0 Number of attempts: 1 Airway Equipment and Method: Bite block Placement Confirmation: positive ETCO2 Tube secured with: Tape Dental Injury: Teeth and Oropharynx as per pre-operative assessment

## 2016-08-10 NOTE — H&P (Signed)
Brett Griffith Patient #: [id number] DOB: [birth year] Married / Language: English / Race: Refused to Report/Unreported Male  History of Present Illness Patient words: This is a very nice 56 year old gentleman who presents with several years history of a umbilical hernia. It has started to bother him with sensations of pain and pressure as he has started to become more active and work out more vigorously recently. He does not have any issues of nausea or vomiting, no bloating, no change in bowel function. No history of incarceration or strangulation. No prior abdominal surgeries. He desires repair. He is not ever been a smoker. 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     Allergies  Ezetimibe-Simvastatin *ANTIHYPERLIPIDEMICS*  Medication History  Aspirin (81MG  Tablet DR, Oral) Active. CoQ10 (Oral) Specific strength unknown - Active. Magnesium (Oral) Specific strength unknown - Active. Multivitamin Adult (Oral) Active. Rosuvastatin Calcium (40MG  Tablet, Oral) Active. Medications Reconciled     Review of Systems  All other systems negative  Vitals:   08/10/16 0926  BP: 136/78  Pulse: 70  Resp: 18  Temp: 98.2 F (36.8 C)      Physical Exam   General Note: Alert and oriented, well-appearing  Integumentary Note: No lesions or rashes on limited skin exam  Head and Neck Note: No mass or thyromegaly  Eye Note: Anicteric, extraocular motion intact  ENMT Note: Moist mucous membranes, good dentition  Chest and Lung Exam Note: Unlabored respirations, symmetrical air entry  Cardiovascular Note: Regular rate and rhythm, no pedal edema  Abdomen Note: Soft, nontender nondistended. Partially reducible umbilical hernia. No mass or organomegaly. No surgical scars  Neurologic Note: Grossly intact, normal gait  Neuropsychiatric Note: Normal mood  and affect, appropriate insight  Musculoskeletal Note: Strength symmetrical throughout, no deformity    Assessment & Plan   UMBILICAL HERNIA (H60.6) Story: He desires repair. I discussed open repair with possible use of mesh. Discussed risks of bleeding, infection, pain, scarring, injury to intra-abdominal structures, hernia recurrence, seroma, etc. he expressed understanding and asked appropriate questions. We will plan to proceed in the coming weeks. He did inquire about laparoscopic repair and has been watching youtube videos on the subject. We discussed that surgery as well and I indicated to him that I do recommend open repair in this instance.

## 2016-08-12 ENCOUNTER — Encounter (HOSPITAL_BASED_OUTPATIENT_CLINIC_OR_DEPARTMENT_OTHER): Payer: Self-pay | Admitting: Surgery

## 2016-09-01 DIAGNOSIS — E669 Obesity, unspecified: Secondary | ICD-10-CM | POA: Diagnosis not present

## 2016-09-01 DIAGNOSIS — E7800 Pure hypercholesterolemia, unspecified: Secondary | ICD-10-CM | POA: Diagnosis not present

## 2016-09-01 DIAGNOSIS — Z713 Dietary counseling and surveillance: Secondary | ICD-10-CM | POA: Diagnosis not present

## 2016-09-02 DIAGNOSIS — M9903 Segmental and somatic dysfunction of lumbar region: Secondary | ICD-10-CM | POA: Diagnosis not present

## 2016-09-02 DIAGNOSIS — M5126 Other intervertebral disc displacement, lumbar region: Secondary | ICD-10-CM | POA: Diagnosis not present

## 2016-09-02 DIAGNOSIS — M9904 Segmental and somatic dysfunction of sacral region: Secondary | ICD-10-CM | POA: Diagnosis not present

## 2016-09-02 DIAGNOSIS — M9905 Segmental and somatic dysfunction of pelvic region: Secondary | ICD-10-CM | POA: Diagnosis not present

## 2016-09-13 DIAGNOSIS — M9904 Segmental and somatic dysfunction of sacral region: Secondary | ICD-10-CM | POA: Diagnosis not present

## 2016-09-13 DIAGNOSIS — M9905 Segmental and somatic dysfunction of pelvic region: Secondary | ICD-10-CM | POA: Diagnosis not present

## 2016-09-13 DIAGNOSIS — M5126 Other intervertebral disc displacement, lumbar region: Secondary | ICD-10-CM | POA: Diagnosis not present

## 2016-09-13 DIAGNOSIS — M9903 Segmental and somatic dysfunction of lumbar region: Secondary | ICD-10-CM | POA: Diagnosis not present

## 2016-09-26 DIAGNOSIS — M9904 Segmental and somatic dysfunction of sacral region: Secondary | ICD-10-CM | POA: Diagnosis not present

## 2016-09-26 DIAGNOSIS — M9905 Segmental and somatic dysfunction of pelvic region: Secondary | ICD-10-CM | POA: Diagnosis not present

## 2016-09-26 DIAGNOSIS — M5126 Other intervertebral disc displacement, lumbar region: Secondary | ICD-10-CM | POA: Diagnosis not present

## 2016-09-26 DIAGNOSIS — M9903 Segmental and somatic dysfunction of lumbar region: Secondary | ICD-10-CM | POA: Diagnosis not present

## 2016-09-27 ENCOUNTER — Ambulatory Visit (INDEPENDENT_AMBULATORY_CARE_PROVIDER_SITE_OTHER): Payer: BLUE CROSS/BLUE SHIELD | Admitting: Cardiology

## 2016-09-27 ENCOUNTER — Encounter: Payer: Self-pay | Admitting: Cardiology

## 2016-09-27 VITALS — BP 140/80 | HR 72 | Ht 67.0 in | Wt 222.0 lb

## 2016-09-27 DIAGNOSIS — I2510 Atherosclerotic heart disease of native coronary artery without angina pectoris: Secondary | ICD-10-CM | POA: Diagnosis not present

## 2016-09-27 DIAGNOSIS — I10 Essential (primary) hypertension: Secondary | ICD-10-CM

## 2016-09-27 DIAGNOSIS — E785 Hyperlipidemia, unspecified: Secondary | ICD-10-CM

## 2016-09-27 DIAGNOSIS — E663 Overweight: Secondary | ICD-10-CM | POA: Diagnosis not present

## 2016-09-27 NOTE — Progress Notes (Signed)
Cardiology Office Note   Date:  09/28/2016   ID:  Charleston, Hankin 1960-07-07, MRN [id number]  PCP:  Biagio Borg, MD  Cardiologist:   Minus Breeding, MD   Chief Complaint  Patient presents with  . Coronary Calcium      History of Present Illness: Brett Griffith is a 56 y.o. male who presents for evaluation of elevated coronary calcium.  I saw him in  2016 for evaluation of dyspnea.  He had extensive coronary calcium.  However, he had a negative stress echo in Jan of last year.  He returns for follow up.   Since I last saw him he had umbilical hernia repair.  He is just getting back to exercising.  The patient denies any new symptoms such as chest discomfort, neck or arm discomfort. There has been no new shortness of breath, PND or orthopnea. There have been no reported palpitations, presyncope or syncope.  Past Medical History:  Diagnosis Date  . History of kidney stones   . HYPERLIPIDEMIA 01/08/2009  . Impaired glucose tolerance 09/21/2010  . NEPHROLITHIASIS, HX OF 01/08/2009  . Shingles   . TIA (transient ischemic attack)     Past Surgical History:  Procedure Laterality Date  . INSERTION OF MESH N/A 08/10/2016   Procedure: INSERTION OF MESH;  Surgeon: Clovis Riley, MD;  Location: [facility];  Service: General;  Laterality: N/A;  . s/p ganglion cyst     right wrist  . s/p pilonidal cystectomy    . UMBILICAL HERNIA REPAIR N/A 08/10/2016   Procedure: UMBILICAL HERNIA REPAIR;  Surgeon: Clovis Riley, MD;  Location: [facility];  Service: General;  Laterality: N/A;  . WISDOM TOOTH EXTRACTION       Current Outpatient Prescriptions  Medication Sig Dispense Refill  . aspirin EC 81 MG tablet Take 1 tablet (81 mg total) by mouth daily. 90 tablet 11  . Coenzyme Q10 (CO Q-10 PO) Take 1 tablet by mouth daily.    . Multiple Vitamin (MULTIVITAMIN) tablet Take 1 tablet by mouth daily.    . rosuvastatin (CRESTOR) 40 MG tablet Take 1 tablet (40  mg total) by mouth daily. 90 tablet 3   No current facility-administered medications for this visit.     Allergies:   Ezetimibe-simvastatin    ROS:  Please see the history of present illness.   Otherwise, review of systems are positive for none.   All other systems are reviewed and negative.    PHYSICAL EXAM: VS:  BP 140/80   Pulse 72   Ht 5\' 7"  (1.702 m)   Wt 222 lb (100.7 kg)   BMI 34.77 kg/m  , BMI Body mass index is 34.77 kg/m.  GENERAL:  Well appearing NECK:  No jugular venous distention, waveform within normal limits, carotid upstroke brisk and symmetric, no bruits, no thyromegaly LUNGS:  Clear to auscultation bilaterally CHEST:  Unremarkable HEART:  PMI not displaced or sustained,S1 and S2 within normal limits, no S3, no S4, no clicks, no rubs, no murmurs ABD:  Flat, positive bowel sounds normal in frequency in pitch, no bruits, no rebound, no guarding, no midline pulsatile mass, no hepatomegaly, no splenomegaly EXT:  2 plus pulses throughout, no edema, no cyanosis no clubbing   EKG:  EKG is ordered today. The ekg ordered today demonstrates sinus rhythm, rate 72, axis within normal limits, intervals within normal limits, no acute ST-T wave changes.   Recent Labs: No results found for  requested labs within last 8760 hours.    Lipid Panel    Component Value Date/Time   CHOL 133 06/12/2015 0801   TRIG 70 06/12/2015 0801   HDL 59 06/12/2015 0801   CHOLHDL 2.3 06/12/2015 0801   VLDL 14 06/12/2015 0801   LDLCALC 60 06/12/2015 0801   LDLDIRECT 123 03/31/2015 0838      Wt Readings from Last 3 Encounters:  09/27/16 222 lb (100.7 kg)  08/10/16 214 lb (97.1 kg)  02/04/16 213 lb (96.6 kg)      Other studies Reviewed: Additional studies/ records that were reviewed today include: None Review of the above records demonstrates:     ASSESSMENT AND PLAN:   CORONARY CALICIUM:    I will bring the patient back for a POET (Plain Old Exercise Test). This will allow me  to screen for obstructive coronary disease, risk stratify and very importantly provide a prescription for exercise.    DYSLIPIDEMIA:  He had some blood work done recently and he will send me the results. If it is not a direct LDL he needed to have this ordered as his calculated LDL has been falsely low in the past. The goal is a direct LDL less than 70.  HTN:   My goal for him is 120s/70s.   Previous readings have been Low and he'll keep an eye on it.   OVERWEIGHT:  I gave him the goal of losing about 15 pounds.   Current medicines are reviewed at length with the patient today.  The patient does not have concerns regarding medicines.  The following changes have been made:  none  Labs/ tests ordered today include:  none  Orders Placed This Encounter  Procedures  . EXERCISE TOLERANCE TEST  . EKG 12-Lead     Disposition:   FU with me in one year.    Signed, Minus Breeding, MD  09/28/2016 9:09 PM    [facility]

## 2016-09-27 NOTE — Patient Instructions (Signed)
Dr Percival Spanish recommends that you continue on your current medications as directed. Please refer to the Current Medication list given to you today.  Your physician has requested that you have an exercise tolerance test. For further information please visit [email or web address]. Please also follow instruction sheet, as given.  Dr Percival Spanish recommends that you schedule a follow-up appointment in 12 months. You will receive a reminder letter in the mail two months in advance. If you don't receive a letter, please call our office to schedule the follow-up appointment.  If you need a refill on your cardiac medications before your next appointment, please call your pharmacy.

## 2016-09-28 ENCOUNTER — Encounter: Payer: Self-pay | Admitting: Cardiology

## 2016-09-28 DIAGNOSIS — I2510 Atherosclerotic heart disease of native coronary artery without angina pectoris: Secondary | ICD-10-CM | POA: Insufficient documentation

## 2016-09-28 DIAGNOSIS — E663 Overweight: Secondary | ICD-10-CM | POA: Insufficient documentation

## 2016-09-28 DIAGNOSIS — E785 Hyperlipidemia, unspecified: Secondary | ICD-10-CM | POA: Insufficient documentation

## 2016-09-28 DIAGNOSIS — I10 Essential (primary) hypertension: Secondary | ICD-10-CM | POA: Insufficient documentation

## 2016-09-29 ENCOUNTER — Other Ambulatory Visit: Payer: Self-pay | Admitting: *Deleted

## 2016-09-29 DIAGNOSIS — E781 Pure hyperglyceridemia: Secondary | ICD-10-CM

## 2016-10-06 DIAGNOSIS — E7800 Pure hypercholesterolemia, unspecified: Secondary | ICD-10-CM | POA: Diagnosis not present

## 2016-10-06 DIAGNOSIS — Z713 Dietary counseling and surveillance: Secondary | ICD-10-CM | POA: Diagnosis not present

## 2016-10-06 DIAGNOSIS — E669 Obesity, unspecified: Secondary | ICD-10-CM | POA: Diagnosis not present

## 2016-10-13 ENCOUNTER — Telehealth (HOSPITAL_COMMUNITY): Payer: Self-pay

## 2016-10-13 DIAGNOSIS — M9905 Segmental and somatic dysfunction of pelvic region: Secondary | ICD-10-CM | POA: Diagnosis not present

## 2016-10-13 DIAGNOSIS — M9903 Segmental and somatic dysfunction of lumbar region: Secondary | ICD-10-CM | POA: Diagnosis not present

## 2016-10-13 DIAGNOSIS — M9904 Segmental and somatic dysfunction of sacral region: Secondary | ICD-10-CM | POA: Diagnosis not present

## 2016-10-13 DIAGNOSIS — M5126 Other intervertebral disc displacement, lumbar region: Secondary | ICD-10-CM | POA: Diagnosis not present

## 2016-10-13 NOTE — Telephone Encounter (Signed)
Encounter complete. 

## 2016-10-14 ENCOUNTER — Ambulatory Visit (HOSPITAL_COMMUNITY)
Admission: RE | Admit: 2016-10-14 | Discharge: 2016-10-14 | Disposition: A | Discharge status: Home / Self Care | Payer: BLUE CROSS/BLUE SHIELD | Source: Ambulatory Visit | Attending: Cardiovascular Disease | Admitting: Cardiovascular Disease

## 2016-10-14 DIAGNOSIS — I2510 Atherosclerotic heart disease of native coronary artery without angina pectoris: Secondary | ICD-10-CM | POA: Diagnosis not present

## 2016-10-14 DIAGNOSIS — E781 Pure hyperglyceridemia: Secondary | ICD-10-CM | POA: Diagnosis not present

## 2016-10-14 LAB — EXERCISE TOLERANCE TEST
Estimated workload: 12.5 METS
Exercise duration (min): 10 min
Exercise duration (sec): 29 s
MPHR: 166 {beats}/min
Peak HR: 164 {beats}/min
Percent HR: 98 %
RPE: 17
Rest HR: 62 {beats}/min

## 2016-10-14 LAB — LDL CHOLESTEROL, DIRECT: LDL Direct: 66 mg/dL (ref 0–99)

## 2016-10-26 DIAGNOSIS — M5126 Other intervertebral disc displacement, lumbar region: Secondary | ICD-10-CM | POA: Diagnosis not present

## 2016-10-26 DIAGNOSIS — M9905 Segmental and somatic dysfunction of pelvic region: Secondary | ICD-10-CM | POA: Diagnosis not present

## 2016-10-26 DIAGNOSIS — M9903 Segmental and somatic dysfunction of lumbar region: Secondary | ICD-10-CM | POA: Diagnosis not present

## 2016-10-26 DIAGNOSIS — M9904 Segmental and somatic dysfunction of sacral region: Secondary | ICD-10-CM | POA: Diagnosis not present

## 2016-11-04 DIAGNOSIS — E669 Obesity, unspecified: Secondary | ICD-10-CM | POA: Diagnosis not present

## 2016-11-04 DIAGNOSIS — E7800 Pure hypercholesterolemia, unspecified: Secondary | ICD-10-CM | POA: Diagnosis not present

## 2016-11-04 DIAGNOSIS — Z713 Dietary counseling and surveillance: Secondary | ICD-10-CM | POA: Diagnosis not present

## 2016-12-01 DIAGNOSIS — E669 Obesity, unspecified: Secondary | ICD-10-CM | POA: Diagnosis not present

## 2016-12-01 DIAGNOSIS — Z713 Dietary counseling and surveillance: Secondary | ICD-10-CM | POA: Diagnosis not present

## 2016-12-01 DIAGNOSIS — E7800 Pure hypercholesterolemia, unspecified: Secondary | ICD-10-CM | POA: Diagnosis not present

## 2016-12-28 DIAGNOSIS — E7800 Pure hypercholesterolemia, unspecified: Secondary | ICD-10-CM | POA: Diagnosis not present

## 2016-12-28 DIAGNOSIS — Z713 Dietary counseling and surveillance: Secondary | ICD-10-CM | POA: Diagnosis not present

## 2016-12-28 DIAGNOSIS — E669 Obesity, unspecified: Secondary | ICD-10-CM | POA: Diagnosis not present

## 2017-01-18 DIAGNOSIS — E669 Obesity, unspecified: Secondary | ICD-10-CM | POA: Diagnosis not present

## 2017-01-18 DIAGNOSIS — Z713 Dietary counseling and surveillance: Secondary | ICD-10-CM | POA: Diagnosis not present

## 2017-01-18 DIAGNOSIS — E7800 Pure hypercholesterolemia, unspecified: Secondary | ICD-10-CM | POA: Diagnosis not present

## 2017-01-19 DIAGNOSIS — Z713 Dietary counseling and surveillance: Secondary | ICD-10-CM | POA: Diagnosis not present

## 2017-01-19 DIAGNOSIS — Z23 Encounter for immunization: Secondary | ICD-10-CM | POA: Diagnosis not present

## 2017-01-19 DIAGNOSIS — E7800 Pure hypercholesterolemia, unspecified: Secondary | ICD-10-CM | POA: Diagnosis not present

## 2017-01-19 DIAGNOSIS — E669 Obesity, unspecified: Secondary | ICD-10-CM | POA: Diagnosis not present

## 2017-01-20 DIAGNOSIS — H52203 Unspecified astigmatism, bilateral: Secondary | ICD-10-CM | POA: Diagnosis not present

## 2017-01-20 DIAGNOSIS — H353122 Nonexudative age-related macular degeneration, left eye, intermediate dry stage: Secondary | ICD-10-CM | POA: Diagnosis not present

## 2017-01-20 DIAGNOSIS — H01001 Unspecified blepharitis right upper eyelid: Secondary | ICD-10-CM | POA: Diagnosis not present

## 2017-01-24 DIAGNOSIS — Z713 Dietary counseling and surveillance: Secondary | ICD-10-CM | POA: Diagnosis not present

## 2017-01-24 DIAGNOSIS — E669 Obesity, unspecified: Secondary | ICD-10-CM | POA: Diagnosis not present

## 2017-01-24 DIAGNOSIS — E7800 Pure hypercholesterolemia, unspecified: Secondary | ICD-10-CM | POA: Diagnosis not present

## 2017-01-27 ENCOUNTER — Other Ambulatory Visit (INDEPENDENT_AMBULATORY_CARE_PROVIDER_SITE_OTHER): Payer: BLUE CROSS/BLUE SHIELD

## 2017-01-27 DIAGNOSIS — Z0001 Encounter for general adult medical examination with abnormal findings: Secondary | ICD-10-CM

## 2017-01-27 DIAGNOSIS — R7302 Impaired glucose tolerance (oral): Secondary | ICD-10-CM | POA: Diagnosis not present

## 2017-01-27 DIAGNOSIS — Z1159 Encounter for screening for other viral diseases: Secondary | ICD-10-CM | POA: Diagnosis not present

## 2017-01-27 LAB — CBC WITH DIFFERENTIAL/PLATELET
Basophils Absolute: 0.1 10*3/uL (ref 0.0–0.1)
Basophils Relative: 1.3 % (ref 0.0–3.0)
Eosinophils Absolute: 0.2 10*3/uL (ref 0.0–0.7)
Eosinophils Relative: 3.3 % (ref 0.0–5.0)
HCT: 46.5 % (ref 39.0–52.0)
Hemoglobin: 15.6 g/dL (ref 13.0–17.0)
Lymphocytes Relative: 33.8 % (ref 12.0–46.0)
Lymphs Abs: 1.9 10*3/uL (ref 0.7–4.0)
MCHC: 33.6 g/dL (ref 30.0–36.0)
MCV: 91.7 fl (ref 78.0–100.0)
Monocytes Absolute: 0.5 10*3/uL (ref 0.1–1.0)
Monocytes Relative: 8.2 % (ref 3.0–12.0)
Neutro Abs: 3 10*3/uL (ref 1.4–7.7)
Neutrophils Relative %: 53.4 % (ref 43.0–77.0)
Platelets: 245 10*3/uL (ref 150.0–400.0)
RBC: 5.07 Mil/uL (ref 4.22–5.81)
RDW: 13.3 % (ref 11.5–15.5)
WBC: 5.6 10*3/uL (ref 4.0–10.5)

## 2017-01-27 LAB — LIPID PANEL
Cholesterol: 124 mg/dL (ref 0–200)
HDL: 49.1 mg/dL (ref >39.00)
LDL Cholesterol: 66 mg/dL (ref 0–99)
NonHDL: 74.86
Total CHOL/HDL Ratio: 3
Triglycerides: 43 mg/dL (ref 0.0–149.0)
VLDL: 8.6 mg/dL (ref 0.0–40.0)

## 2017-01-27 LAB — URINALYSIS, ROUTINE W REFLEX MICROSCOPIC
Bilirubin Urine: NEGATIVE
Hgb urine dipstick: NEGATIVE
Ketones, ur: NEGATIVE
Leukocytes, UA: NEGATIVE
Nitrite: NEGATIVE
RBC / HPF: NONE SEEN (ref 0–?)
Specific Gravity, Urine: >=1.03 — AB (ref 1.000–1.030)
Total Protein, Urine: NEGATIVE
Urine Glucose: NEGATIVE
Urobilinogen, UA: 0.2 (ref 0.0–1.0)
pH: 6 (ref 5.0–8.0)

## 2017-01-27 LAB — BASIC METABOLIC PANEL
BUN: 16 mg/dL (ref 6–23)
CO2: 27 mEq/L (ref 19–32)
Calcium: 9.5 mg/dL (ref 8.4–10.5)
Chloride: 107 mEq/L (ref 96–112)
Creatinine, Ser: 0.98 mg/dL (ref 0.40–1.50)
GFR: 84.55 mL/min (ref >60.00)
Glucose, Bld: 108 mg/dL — ABNORMAL HIGH (ref 70–99)
Potassium: 4.4 mEq/L (ref 3.5–5.1)
Sodium: 141 mEq/L (ref 135–145)

## 2017-01-27 LAB — HEPATIC FUNCTION PANEL
ALT: 29 U/L (ref 0–53)
AST: 26 U/L (ref 0–37)
Albumin: 4.5 g/dL (ref 3.5–5.2)
Alkaline Phosphatase: 47 U/L (ref 39–117)
Bilirubin, Direct: 0.1 mg/dL (ref 0.0–0.3)
Total Bilirubin: 0.6 mg/dL (ref 0.2–1.2)
Total Protein: 6.5 g/dL (ref 6.0–8.3)

## 2017-01-27 LAB — HEMOGLOBIN A1C: Hgb A1c MFr Bld: 6 % (ref 4.6–6.5)

## 2017-01-27 LAB — TSH: TSH: 1.07 u[IU]/mL (ref 0.35–4.50)

## 2017-01-27 LAB — PSA: PSA: 1 ng/mL (ref 0.10–4.00)

## 2017-01-28 LAB — HEPATITIS C ANTIBODY
Hepatitis C Ab: NONREACTIVE
SIGNAL TO CUT-OFF: 0 (ref <1.00)

## 2017-02-03 DIAGNOSIS — M9905 Segmental and somatic dysfunction of pelvic region: Secondary | ICD-10-CM | POA: Diagnosis not present

## 2017-02-03 DIAGNOSIS — M9903 Segmental and somatic dysfunction of lumbar region: Secondary | ICD-10-CM | POA: Diagnosis not present

## 2017-02-03 DIAGNOSIS — M9904 Segmental and somatic dysfunction of sacral region: Secondary | ICD-10-CM | POA: Diagnosis not present

## 2017-02-03 DIAGNOSIS — M5126 Other intervertebral disc displacement, lumbar region: Secondary | ICD-10-CM | POA: Diagnosis not present

## 2017-02-08 ENCOUNTER — Encounter: Payer: Self-pay | Admitting: Internal Medicine

## 2017-02-08 ENCOUNTER — Ambulatory Visit (INDEPENDENT_AMBULATORY_CARE_PROVIDER_SITE_OTHER): Payer: BLUE CROSS/BLUE SHIELD | Admitting: Internal Medicine

## 2017-02-08 VITALS — BP 134/82 | HR 67 | Temp 98.4°F | Ht 67.0 in | Wt 220.0 lb

## 2017-02-08 DIAGNOSIS — Z114 Encounter for screening for human immunodeficiency virus [HIV]: Secondary | ICD-10-CM

## 2017-02-08 DIAGNOSIS — I10 Essential (primary) hypertension: Secondary | ICD-10-CM | POA: Diagnosis not present

## 2017-02-08 DIAGNOSIS — Z0000 Encounter for general adult medical examination without abnormal findings: Secondary | ICD-10-CM

## 2017-02-08 DIAGNOSIS — R7302 Impaired glucose tolerance (oral): Secondary | ICD-10-CM

## 2017-02-08 DIAGNOSIS — Z86010 Personal history of colonic polyps: Secondary | ICD-10-CM | POA: Diagnosis not present

## 2017-02-08 MED ORDER — ROSUVASTATIN CALCIUM 40 MG PO TABS: 40.0000 mg | ORAL_TABLET | Freq: Every day | ORAL | 3 refills | Status: DC

## 2017-02-08 NOTE — Progress Notes (Signed)
Subjective:    Patient ID: Brett Griffith, male    DOB: [birth year], 56 y.o.   MRN: [id number]  HPI  Here for wellness and f/u;  Overall doing ok;  Pt denies Chest pain, worsening SOB, DOE, wheezing, orthopnea, PND, worsening LE edema, palpitations, dizziness or syncope.  Pt denies neurological change such as new headache, facial or extremity weakness.  Pt denies polydipsia, polyuria, or low sugar symptoms. Pt states overall good compliance with treatment and medications, good tolerability, and has been trying to follow appropriate diet.  Pt denies worsening depressive symptoms, suicidal ideation or panic. No fever, night sweats, wt loss, loss of appetite, or other constitutional symptoms.  Pt states good ability with ADL's, has low fall risk, home safety reviewed and adequate, no other significant changes in hearing or vision, and very active with exercise with gym at least 5 days per wk.  No other complaints or interval hx BP Readings from Last 3 Encounters:  02/08/17 134/82  09/27/16 140/80  08/10/16 123/74  Wt up and down but mostly down with more exercise recently and better diet.  Wt Readings from Last 3 Encounters:  02/08/17 220 lb (99.8 kg)  09/27/16 222 lb (100.7 kg)  08/10/16 214 lb (97.1 kg)   Past Medical History:  Diagnosis Date  . History of kidney stones   . HYPERLIPIDEMIA 01/08/2009  . Impaired glucose tolerance 09/21/2010  . NEPHROLITHIASIS, HX OF 01/08/2009  . Shingles   . TIA (transient ischemic attack)    Past Surgical History:  Procedure Laterality Date  . INSERTION OF MESH N/A 08/10/2016   Performed by Clovis Riley, MD at [facility]  . s/p ganglion cyst     right wrist  . s/p pilonidal cystectomy    . UMBILICAL HERNIA REPAIR N/A 08/10/2016   Performed by Clovis Riley, MD at [facility]  . WISDOM TOOTH EXTRACTION      reports that  has never smoked. he has never used smokeless tobacco. He reports that he drinks about 1.2 oz  of alcohol per week. He reports that he does not use drugs. family history includes CAD (age of onset: 57) in his brother; CAD (age of onset: 21) in his father; Hypertension in his brother, father, and mother; Peripheral vascular disease (age of onset: 8) in his mother. Allergies  Allergen Reactions  . Ezetimibe-Simvastatin     REACTION: more emotional   Review of Systems Constitutional: Negative for other unusual diaphoresis, sweats, appetite or weight changes HENT: Negative for other worsening hearing loss, ear pain, facial swelling, mouth sores or neck stiffness.   Eyes: Negative for other worsening pain, redness or other visual disturbance.  Respiratory: Negative for other stridor or swelling Cardiovascular: Negative for other palpitations or other chest pain  Gastrointestinal: Negative for worsening diarrhea or loose stools, blood in stool, distention or other pain Genitourinary: Negative for hematuria, flank pain or other change in urine volume.  Musculoskeletal: Negative for myalgias or other joint swelling.  Skin: Negative for other color change, or other wound or worsening drainage.  Neurological: Negative for other syncope or numbness. Hematological: Negative for other adenopathy or swelling Psychiatric/Behavioral: Negative for hallucinations, other worsening agitation, SI, self-injury, or new decreased concentration All other system neg per pt    Objective:   Physical Exam BP 134/82   Pulse 67   Temp 98.4 F (36.9 C) (Oral)   Ht 5\' 7"  (1.702 m)   Wt 220 lb (99.8 kg)  SpO2 98%   BMI 34.46 kg/m  VS noted,  Constitutional: Pt is oriented to person, place, and time. Appears well-developed and well-nourished, in no significant distress and comfortable Head: Normocephalic and atraumatic  Eyes: Conjunctivae and EOM are normal. Pupils are equal, round, and reactive to light Right Ear: External ear normal without discharge Left Ear: External ear normal without  discharge Nose: Nose without discharge or deformity Mouth/Throat: Oropharynx is without other ulcerations and moist  Neck: Normal range of motion. Neck supple. No JVD present. No tracheal deviation present or significant neck LA or mass Cardiovascular: Normal rate, regular rhythm, normal heart sounds and intact distal pulses.   Pulmonary/Chest: WOB normal and breath sounds without rales or wheezing  Abdominal: Soft. Bowel sounds are normal. NT. No HSM  Musculoskeletal: Normal range of motion. Exhibits no edema Lymphadenopathy: Has no other cervical adenopathy.  Neurological: Pt is alert and oriented to person, place, and time. Pt has normal reflexes. No cranial nerve deficit. Motor grossly intact, Gait intact Skin: Skin is warm and dry. No rash noted or new ulcerations Psychiatric:  Has normal mood and affect. Behavior is normal without agitation No other exam findings Lab Results  Component Value Date   WBC 5.6 01/27/2017   HGB 15.6 01/27/2017   HCT 46.5 01/27/2017   PLT 245.0 01/27/2017   GLUCOSE 108 (H) 01/27/2017   CHOL 124 01/27/2017   TRIG 43.0 01/27/2017   HDL 49.10 01/27/2017   LDLDIRECT 66 10/14/2016   LDLCALC 66 01/27/2017   ALT 29 01/27/2017   AST 26 01/27/2017   NA 141 01/27/2017   K 4.4 01/27/2017   CL 107 01/27/2017   CREATININE 0.98 01/27/2017   BUN 16 01/27/2017   CO2 27 01/27/2017   TSH 1.07 01/27/2017   PSA 1.00 01/27/2017   INR 0.97 10/07/2009   HGBA1C 6.0 01/27/2017      Assessment & Plan:

## 2017-02-08 NOTE — Patient Instructions (Addendum)
You will be contacted regarding the referral for: colonoscopy  Please continue all other medications as before, and refills have been done if requested.  Please have the pharmacy call with any other refills you may need.  Please continue your efforts at being more active, low cholesterol diet, and weight control.  You are otherwise up to date with prevention measures today.  Please keep your appointments with your specialists as you may have planned  Please return in 1 year for your yearly visit, or sooner if needed, with Lab testing done 3-5 days before  

## 2017-02-11 ENCOUNTER — Encounter: Payer: Self-pay | Admitting: Internal Medicine

## 2017-02-11 NOTE — Assessment & Plan Note (Signed)
stable overall by history and exam, recent data reviewed with pt, and pt to continue medical treatment as before,  to f/u any worsening symptoms or concerns BP Readings from Last 3 Encounters:  02/08/17 134/82  09/27/16 140/80  08/10/16 123/74

## 2017-02-11 NOTE — Assessment & Plan Note (Signed)

## 2017-02-11 NOTE — Assessment & Plan Note (Signed)
stable overall by history and exam, recent data reviewed with pt, and pt to continue medical treatment as before,  to f/u any worsening symptoms or concerns Lab Results  Component Value Date   HGBA1C 6.0 01/27/2017

## 2017-02-14 ENCOUNTER — Encounter: Payer: Self-pay | Admitting: Internal Medicine

## 2017-02-23 ENCOUNTER — Other Ambulatory Visit: Payer: Self-pay

## 2017-02-23 DIAGNOSIS — D224 Melanocytic nevi of scalp and neck: Secondary | ICD-10-CM | POA: Diagnosis not present

## 2017-02-23 DIAGNOSIS — D229 Melanocytic nevi, unspecified: Secondary | ICD-10-CM

## 2017-02-23 DIAGNOSIS — D492 Neoplasm of unspecified behavior of bone, soft tissue, and skin: Secondary | ICD-10-CM | POA: Diagnosis not present

## 2017-02-23 DIAGNOSIS — L570 Actinic keratosis: Secondary | ICD-10-CM | POA: Diagnosis not present

## 2017-02-23 HISTORY — DX: Melanocytic nevi, unspecified: D22.9

## 2017-02-28 DIAGNOSIS — E669 Obesity, unspecified: Secondary | ICD-10-CM | POA: Diagnosis not present

## 2017-02-28 DIAGNOSIS — Z713 Dietary counseling and surveillance: Secondary | ICD-10-CM | POA: Diagnosis not present

## 2017-02-28 DIAGNOSIS — E7800 Pure hypercholesterolemia, unspecified: Secondary | ICD-10-CM | POA: Diagnosis not present

## 2017-03-10 DIAGNOSIS — M9905 Segmental and somatic dysfunction of pelvic region: Secondary | ICD-10-CM | POA: Diagnosis not present

## 2017-03-10 DIAGNOSIS — M5126 Other intervertebral disc displacement, lumbar region: Secondary | ICD-10-CM | POA: Diagnosis not present

## 2017-03-10 DIAGNOSIS — M9904 Segmental and somatic dysfunction of sacral region: Secondary | ICD-10-CM | POA: Diagnosis not present

## 2017-03-10 DIAGNOSIS — M9903 Segmental and somatic dysfunction of lumbar region: Secondary | ICD-10-CM | POA: Diagnosis not present

## 2017-03-24 DIAGNOSIS — M9903 Segmental and somatic dysfunction of lumbar region: Secondary | ICD-10-CM | POA: Diagnosis not present

## 2017-03-24 DIAGNOSIS — M5126 Other intervertebral disc displacement, lumbar region: Secondary | ICD-10-CM | POA: Diagnosis not present

## 2017-03-24 DIAGNOSIS — M9904 Segmental and somatic dysfunction of sacral region: Secondary | ICD-10-CM | POA: Diagnosis not present

## 2017-03-24 DIAGNOSIS — M9905 Segmental and somatic dysfunction of pelvic region: Secondary | ICD-10-CM | POA: Diagnosis not present

## 2017-03-29 DIAGNOSIS — M5127 Other intervertebral disc displacement, lumbosacral region: Secondary | ICD-10-CM | POA: Diagnosis not present

## 2017-03-29 DIAGNOSIS — M5137 Other intervertebral disc degeneration, lumbosacral region: Secondary | ICD-10-CM | POA: Diagnosis not present

## 2017-03-29 DIAGNOSIS — M791 Myalgia, unspecified site: Secondary | ICD-10-CM | POA: Diagnosis not present

## 2017-03-29 DIAGNOSIS — M545 Low back pain: Secondary | ICD-10-CM | POA: Diagnosis not present

## 2017-03-29 DIAGNOSIS — M50222 Other cervical disc displacement at C5-C6 level: Secondary | ICD-10-CM | POA: Diagnosis not present

## 2017-03-31 DIAGNOSIS — M545 Low back pain: Secondary | ICD-10-CM | POA: Diagnosis not present

## 2017-03-31 DIAGNOSIS — M5137 Other intervertebral disc degeneration, lumbosacral region: Secondary | ICD-10-CM | POA: Diagnosis not present

## 2017-03-31 DIAGNOSIS — M791 Myalgia, unspecified site: Secondary | ICD-10-CM | POA: Diagnosis not present

## 2017-03-31 DIAGNOSIS — M5127 Other intervertebral disc displacement, lumbosacral region: Secondary | ICD-10-CM | POA: Diagnosis not present

## 2017-03-31 DIAGNOSIS — M50222 Other cervical disc displacement at C5-C6 level: Secondary | ICD-10-CM | POA: Diagnosis not present

## 2017-04-04 DIAGNOSIS — M791 Myalgia, unspecified site: Secondary | ICD-10-CM | POA: Diagnosis not present

## 2017-04-04 DIAGNOSIS — M5137 Other intervertebral disc degeneration, lumbosacral region: Secondary | ICD-10-CM | POA: Diagnosis not present

## 2017-04-04 DIAGNOSIS — M5127 Other intervertebral disc displacement, lumbosacral region: Secondary | ICD-10-CM | POA: Diagnosis not present

## 2017-04-04 DIAGNOSIS — M50222 Other cervical disc displacement at C5-C6 level: Secondary | ICD-10-CM | POA: Diagnosis not present

## 2017-04-04 DIAGNOSIS — M545 Low back pain: Secondary | ICD-10-CM | POA: Diagnosis not present

## 2017-04-05 DIAGNOSIS — M9904 Segmental and somatic dysfunction of sacral region: Secondary | ICD-10-CM | POA: Diagnosis not present

## 2017-04-05 DIAGNOSIS — M9905 Segmental and somatic dysfunction of pelvic region: Secondary | ICD-10-CM | POA: Diagnosis not present

## 2017-04-05 DIAGNOSIS — M9903 Segmental and somatic dysfunction of lumbar region: Secondary | ICD-10-CM | POA: Diagnosis not present

## 2017-04-05 DIAGNOSIS — M5126 Other intervertebral disc displacement, lumbar region: Secondary | ICD-10-CM | POA: Diagnosis not present

## 2017-04-06 ENCOUNTER — Other Ambulatory Visit: Payer: Self-pay

## 2017-04-06 DIAGNOSIS — D485 Neoplasm of uncertain behavior of skin: Secondary | ICD-10-CM | POA: Diagnosis not present

## 2017-04-06 DIAGNOSIS — M50222 Other cervical disc displacement at C5-C6 level: Secondary | ICD-10-CM | POA: Diagnosis not present

## 2017-04-06 DIAGNOSIS — M5137 Other intervertebral disc degeneration, lumbosacral region: Secondary | ICD-10-CM | POA: Diagnosis not present

## 2017-04-06 DIAGNOSIS — M791 Myalgia, unspecified site: Secondary | ICD-10-CM | POA: Diagnosis not present

## 2017-04-06 DIAGNOSIS — M545 Low back pain: Secondary | ICD-10-CM | POA: Diagnosis not present

## 2017-04-06 DIAGNOSIS — E7800 Pure hypercholesterolemia, unspecified: Secondary | ICD-10-CM | POA: Diagnosis not present

## 2017-04-06 DIAGNOSIS — Z713 Dietary counseling and surveillance: Secondary | ICD-10-CM | POA: Diagnosis not present

## 2017-04-06 DIAGNOSIS — M5127 Other intervertebral disc displacement, lumbosacral region: Secondary | ICD-10-CM | POA: Diagnosis not present

## 2017-04-06 DIAGNOSIS — E669 Obesity, unspecified: Secondary | ICD-10-CM | POA: Diagnosis not present

## 2017-04-11 ENCOUNTER — Ambulatory Visit (AMBULATORY_SURGERY_CENTER): Payer: Self-pay

## 2017-04-11 ENCOUNTER — Other Ambulatory Visit: Payer: Self-pay

## 2017-04-11 VITALS — Ht 67.0 in | Wt 224.0 lb

## 2017-04-11 DIAGNOSIS — M5127 Other intervertebral disc displacement, lumbosacral region: Secondary | ICD-10-CM | POA: Diagnosis not present

## 2017-04-11 DIAGNOSIS — M545 Low back pain: Secondary | ICD-10-CM | POA: Diagnosis not present

## 2017-04-11 DIAGNOSIS — Z86010 Personal history of colonic polyps: Secondary | ICD-10-CM

## 2017-04-11 DIAGNOSIS — M791 Myalgia, unspecified site: Secondary | ICD-10-CM | POA: Diagnosis not present

## 2017-04-11 DIAGNOSIS — M5137 Other intervertebral disc degeneration, lumbosacral region: Secondary | ICD-10-CM | POA: Diagnosis not present

## 2017-04-11 DIAGNOSIS — M50222 Other cervical disc displacement at C5-C6 level: Secondary | ICD-10-CM | POA: Diagnosis not present

## 2017-04-11 MED ORDER — NA SULFATE-K SULFATE-MG SULF 17.5-3.13-1.6 GM/177ML PO SOLN: 1.0000 | Freq: Once | ORAL | 0 refills | Status: AC

## 2017-04-11 NOTE — Progress Notes (Signed)
Denies allergies to eggs or soy products. Denies complication of anesthesia or sedation. Denies use of weight loss medication. Denies use of O2.   Emmi instructions given for colonoscopy.  

## 2017-04-13 ENCOUNTER — Encounter: Payer: Self-pay | Admitting: Internal Medicine

## 2017-04-13 DIAGNOSIS — M791 Myalgia, unspecified site: Secondary | ICD-10-CM | POA: Diagnosis not present

## 2017-04-13 DIAGNOSIS — M50222 Other cervical disc displacement at C5-C6 level: Secondary | ICD-10-CM | POA: Diagnosis not present

## 2017-04-13 DIAGNOSIS — M545 Low back pain: Secondary | ICD-10-CM | POA: Diagnosis not present

## 2017-04-13 DIAGNOSIS — M5127 Other intervertebral disc displacement, lumbosacral region: Secondary | ICD-10-CM | POA: Diagnosis not present

## 2017-04-13 DIAGNOSIS — M5137 Other intervertebral disc degeneration, lumbosacral region: Secondary | ICD-10-CM | POA: Diagnosis not present

## 2017-04-18 DIAGNOSIS — M545 Low back pain: Secondary | ICD-10-CM | POA: Diagnosis not present

## 2017-04-18 DIAGNOSIS — M791 Myalgia, unspecified site: Secondary | ICD-10-CM | POA: Diagnosis not present

## 2017-04-18 DIAGNOSIS — M5137 Other intervertebral disc degeneration, lumbosacral region: Secondary | ICD-10-CM | POA: Diagnosis not present

## 2017-04-18 DIAGNOSIS — M50222 Other cervical disc displacement at C5-C6 level: Secondary | ICD-10-CM | POA: Diagnosis not present

## 2017-04-18 DIAGNOSIS — M5127 Other intervertebral disc displacement, lumbosacral region: Secondary | ICD-10-CM | POA: Diagnosis not present

## 2017-04-19 DIAGNOSIS — M9903 Segmental and somatic dysfunction of lumbar region: Secondary | ICD-10-CM | POA: Diagnosis not present

## 2017-04-19 DIAGNOSIS — M9904 Segmental and somatic dysfunction of sacral region: Secondary | ICD-10-CM | POA: Diagnosis not present

## 2017-04-19 DIAGNOSIS — M5126 Other intervertebral disc displacement, lumbar region: Secondary | ICD-10-CM | POA: Diagnosis not present

## 2017-04-19 DIAGNOSIS — M9905 Segmental and somatic dysfunction of pelvic region: Secondary | ICD-10-CM | POA: Diagnosis not present

## 2017-04-20 DIAGNOSIS — M545 Low back pain: Secondary | ICD-10-CM | POA: Diagnosis not present

## 2017-04-20 DIAGNOSIS — M50222 Other cervical disc displacement at C5-C6 level: Secondary | ICD-10-CM | POA: Diagnosis not present

## 2017-04-20 DIAGNOSIS — M791 Myalgia, unspecified site: Secondary | ICD-10-CM | POA: Diagnosis not present

## 2017-04-20 DIAGNOSIS — M5137 Other intervertebral disc degeneration, lumbosacral region: Secondary | ICD-10-CM | POA: Diagnosis not present

## 2017-04-20 DIAGNOSIS — M5127 Other intervertebral disc displacement, lumbosacral region: Secondary | ICD-10-CM | POA: Diagnosis not present

## 2017-04-25 ENCOUNTER — Other Ambulatory Visit: Payer: Self-pay

## 2017-04-25 ENCOUNTER — Encounter: Payer: Self-pay | Admitting: Internal Medicine

## 2017-04-25 ENCOUNTER — Ambulatory Visit (AMBULATORY_SURGERY_CENTER): Payer: BLUE CROSS/BLUE SHIELD | Admitting: Internal Medicine

## 2017-04-25 VITALS — BP 109/80 | HR 64 | Temp 97.8°F | Resp 15 | Ht 67.0 in | Wt 224.0 lb

## 2017-04-25 DIAGNOSIS — Z1211 Encounter for screening for malignant neoplasm of colon: Secondary | ICD-10-CM | POA: Diagnosis not present

## 2017-04-25 DIAGNOSIS — Z86010 Personal history of colonic polyps: Secondary | ICD-10-CM | POA: Diagnosis present

## 2017-04-25 MED ORDER — SODIUM CHLORIDE 0.9 % IV SOLN: 500.0000 mL | Freq: Once | INTRAVENOUS | Status: DC

## 2017-04-25 NOTE — Progress Notes (Signed)
Pt's states no medical or surgical changes since previsit or office visit. 

## 2017-04-25 NOTE — Patient Instructions (Signed)
YOU HAD AN ENDOSCOPIC PROCEDURE TODAY AT THE [facility]:   Refer to the procedure report that was given to you for any specific questions about what was found during the examination.  If the procedure report does not answer your questions, please call your gastroenterologist to clarify.  If you requested that your care partner not be given the details of your procedure findings, then the procedure report has been included in a sealed envelope for you to review at your convenience later.  YOU SHOULD EXPECT: Some feelings of bloating in the abdomen. Passage of more gas than usual.  Walking can help get rid of the air that was put into your GI tract during the procedure and reduce the bloating. If you had a lower endoscopy (such as a colonoscopy or flexible sigmoidoscopy) you may notice spotting of blood in your stool or on the toilet paper. If you underwent a bowel prep for your procedure, you may not have a normal bowel movement for a few days.  Please Note:  You might notice some irritation and congestion in your nose or some drainage.  This is from the oxygen used during your procedure.  There is no need for concern and it should clear up in a day or so.  SYMPTOMS TO REPORT IMMEDIATELY:   Following lower endoscopy (colonoscopy or flexible sigmoidoscopy):  Excessive amounts of blood in the stool  Significant tenderness or worsening of abdominal pains  Swelling of the abdomen that is new, acute  Fever of 100F or higher  For urgent or emergent issues, a gastroenterologist can be reached at any hour by calling [phone].   DIET:  We do recommend a small meal at first, but then you may proceed to your regular diet.  Drink plenty of fluids but you should avoid alcoholic beverages for 24 hours.  MEDICATIONS:  Continue present medications.  ACTIVITY:  You should plan to take it easy for the rest of today and you should NOT DRIVE or use heavy machinery until tomorrow (because of the  sedation medicines used during the test).    FOLLOW UP: Our staff will call the number listed on your records the next business day following your procedure to check on you and address any questions or concerns that you may have regarding the information given to you following your procedure. If we do not reach you, we will leave a message.  However, if you are feeling well and you are not experiencing any problems, there is no need to return our call.  We will assume that you have returned to your regular daily activities without incident.  If any biopsies were taken you will be contacted by phone or by letter within the next 1-3 weeks.  Please call us at [phone] if you have not heard about the biopsies in 3 weeks.   Thank you for allowing us to provide for your healthcare needs today.   SIGNATURES/CONFIDENTIALITY: You and/or your care partner have signed paperwork which will be entered into your electronic medical record.  These signatures attest to the fact that that the information above on your After Visit Summary has been reviewed and is understood.  Full responsibility of the confidentiality of this discharge information lies with you and/or your care-partner. 

## 2017-04-25 NOTE — Progress Notes (Signed)
A and O x3. Report to RN. Tolerated MAC anesthesia well.

## 2017-04-25 NOTE — Op Note (Signed)
[facility] Patient Name: Brett Griffith Procedure Date: 04/25/2017 8:00 AM MRN: [id number] Endoscopist: Docia Chuck. Henrene Pastor , MD Age: 57 Referring MD:  Date of Birth: 1961/03/16 Gender: Male Account #: [id number] Procedure:                Colonoscopy Indications:              High risk colon cancer surveillance: Personal                            history of adenoma (10 mm or greater in size), High                            risk colon cancer surveillance: Personal history of                            sessile serrated colon polyp (less than 10 mm in                            size) with no dysplasia. Index examination November                            2015 Medicines:                Monitored Anesthesia Care Procedure:                Pre-Anesthesia Assessment:                           - Prior to the procedure, a History and Physical                            was performed, and patient medications and                            allergies were reviewed. The patient's tolerance of                            previous anesthesia was also reviewed. The risks                            and benefits of the procedure and the sedation                            options and risks were discussed with the patient.                            All questions were answered, and informed consent                            was obtained. Prior Anticoagulants: The patient has                            taken no previous anticoagulant or antiplatelet  agents. ASA Grade Assessment: II - A patient with                            mild systemic disease. After reviewing the risks                            and benefits, the patient was deemed in                            satisfactory condition to undergo the procedure.                           After obtaining informed consent, the colonoscope                            was passed under direct vision. Throughout the                  procedure, the patient's blood pressure, pulse, and                            oxygen saturations were monitored continuously. The                            Colonoscope was introduced through the anus and                            advanced to the the cecum, identified by                            appendiceal orifice and ileocecal valve. The                            ileocecal valve, appendiceal orifice, and rectum                            were photographed. The quality of the bowel                            preparation was excellent. The colonoscopy was                            performed without difficulty. The patient tolerated                            the procedure well. The bowel preparation used was                            SUPREP. Scope In: 8:11:50 AM Scope Out: 8:24:52 AM Scope Withdrawal Time: 0 hours 10 minutes 35 seconds  Total Procedure Duration: 0 hours 13 minutes 2 seconds  Findings:                 The entire examined colon appeared normal on direct  and retroflexion views. Complications:            No immediate complications. Estimated blood loss:                            None. Estimated Blood Loss:     Estimated blood loss: none. Impression:               - The entire examined colon is normal on direct and                            retroflexion views.                           - No specimens collected. Recommendation:           - Repeat colonoscopy in 5 years for surveillance.                           - Patient has a contact number available for                            emergencies. The signs and symptoms of potential                            delayed complications were discussed with the                            patient. Return to normal activities tomorrow.                            Written discharge instructions were provided to the                            patient.                           - Resume  previous diet.                           - Continue present medications. Docia Chuck. Henrene Pastor, MD 04/25/2017 8:33:17 AM This report has been signed electronically.

## 2017-04-26 ENCOUNTER — Telehealth: Payer: Self-pay | Admitting: *Deleted

## 2017-04-26 DIAGNOSIS — M542 Cervicalgia: Secondary | ICD-10-CM | POA: Diagnosis not present

## 2017-04-26 DIAGNOSIS — M5137 Other intervertebral disc degeneration, lumbosacral region: Secondary | ICD-10-CM | POA: Diagnosis not present

## 2017-04-26 DIAGNOSIS — M5127 Other intervertebral disc displacement, lumbosacral region: Secondary | ICD-10-CM | POA: Diagnosis not present

## 2017-04-26 DIAGNOSIS — M50222 Other cervical disc displacement at C5-C6 level: Secondary | ICD-10-CM | POA: Diagnosis not present

## 2017-04-26 DIAGNOSIS — M791 Myalgia, unspecified site: Secondary | ICD-10-CM | POA: Diagnosis not present

## 2017-04-26 NOTE — Telephone Encounter (Signed)
  Follow up Call-  Call back number 04/25/2017  Post procedure Call Back phone  # [phone]  Permission to leave phone message Yes  Some recent data might be hidden     Patient questions:  Do you have a fever, pain , or abdominal swelling? No. Pain Score  0 *  Have you tolerated food without any problems? Yes.    Have you been able to return to your normal activities? Yes.    Do you have any questions about your discharge instructions: Diet   No. Medications  No. Follow up visit  No.  Do you have questions or concerns about your Care? No.  Actions: * If pain score is 4 or above: No action needed, pain <4.

## 2017-04-27 DIAGNOSIS — M791 Myalgia, unspecified site: Secondary | ICD-10-CM | POA: Diagnosis not present

## 2017-04-27 DIAGNOSIS — M5137 Other intervertebral disc degeneration, lumbosacral region: Secondary | ICD-10-CM | POA: Diagnosis not present

## 2017-04-27 DIAGNOSIS — M50222 Other cervical disc displacement at C5-C6 level: Secondary | ICD-10-CM | POA: Diagnosis not present

## 2017-04-27 DIAGNOSIS — M5127 Other intervertebral disc displacement, lumbosacral region: Secondary | ICD-10-CM | POA: Diagnosis not present

## 2017-05-02 DIAGNOSIS — M5127 Other intervertebral disc displacement, lumbosacral region: Secondary | ICD-10-CM | POA: Diagnosis not present

## 2017-05-02 DIAGNOSIS — M50222 Other cervical disc displacement at C5-C6 level: Secondary | ICD-10-CM | POA: Diagnosis not present

## 2017-05-02 DIAGNOSIS — M542 Cervicalgia: Secondary | ICD-10-CM | POA: Diagnosis not present

## 2017-05-02 DIAGNOSIS — M5137 Other intervertebral disc degeneration, lumbosacral region: Secondary | ICD-10-CM | POA: Diagnosis not present

## 2017-05-02 DIAGNOSIS — M791 Myalgia, unspecified site: Secondary | ICD-10-CM | POA: Diagnosis not present

## 2017-05-04 DIAGNOSIS — L570 Actinic keratosis: Secondary | ICD-10-CM | POA: Diagnosis not present

## 2017-05-04 DIAGNOSIS — M50222 Other cervical disc displacement at C5-C6 level: Secondary | ICD-10-CM | POA: Diagnosis not present

## 2017-05-04 DIAGNOSIS — M542 Cervicalgia: Secondary | ICD-10-CM | POA: Diagnosis not present

## 2017-05-04 DIAGNOSIS — M5137 Other intervertebral disc degeneration, lumbosacral region: Secondary | ICD-10-CM | POA: Diagnosis not present

## 2017-05-04 DIAGNOSIS — M791 Myalgia, unspecified site: Secondary | ICD-10-CM | POA: Diagnosis not present

## 2017-05-04 DIAGNOSIS — M5127 Other intervertebral disc displacement, lumbosacral region: Secondary | ICD-10-CM | POA: Diagnosis not present

## 2017-05-09 DIAGNOSIS — M50322 Other cervical disc degeneration at C5-C6 level: Secondary | ICD-10-CM | POA: Diagnosis not present

## 2017-05-09 DIAGNOSIS — M50223 Other cervical disc displacement at C6-C7 level: Secondary | ICD-10-CM | POA: Diagnosis not present

## 2017-05-09 DIAGNOSIS — M791 Myalgia, unspecified site: Secondary | ICD-10-CM | POA: Diagnosis not present

## 2017-05-09 DIAGNOSIS — M542 Cervicalgia: Secondary | ICD-10-CM | POA: Diagnosis not present

## 2017-05-09 DIAGNOSIS — M5127 Other intervertebral disc displacement, lumbosacral region: Secondary | ICD-10-CM | POA: Diagnosis not present

## 2017-05-11 DIAGNOSIS — M50223 Other cervical disc displacement at C6-C7 level: Secondary | ICD-10-CM | POA: Diagnosis not present

## 2017-05-11 DIAGNOSIS — M50322 Other cervical disc degeneration at C5-C6 level: Secondary | ICD-10-CM | POA: Diagnosis not present

## 2017-05-11 DIAGNOSIS — M5127 Other intervertebral disc displacement, lumbosacral region: Secondary | ICD-10-CM | POA: Diagnosis not present

## 2017-05-11 DIAGNOSIS — M542 Cervicalgia: Secondary | ICD-10-CM | POA: Diagnosis not present

## 2017-05-11 DIAGNOSIS — M791 Myalgia, unspecified site: Secondary | ICD-10-CM | POA: Diagnosis not present

## 2017-05-16 DIAGNOSIS — M542 Cervicalgia: Secondary | ICD-10-CM | POA: Diagnosis not present

## 2017-05-16 DIAGNOSIS — M5127 Other intervertebral disc displacement, lumbosacral region: Secondary | ICD-10-CM | POA: Diagnosis not present

## 2017-05-16 DIAGNOSIS — M791 Myalgia, unspecified site: Secondary | ICD-10-CM | POA: Diagnosis not present

## 2017-05-16 DIAGNOSIS — H353121 Nonexudative age-related macular degeneration, left eye, early dry stage: Secondary | ICD-10-CM | POA: Diagnosis not present

## 2017-05-16 DIAGNOSIS — M50322 Other cervical disc degeneration at C5-C6 level: Secondary | ICD-10-CM | POA: Diagnosis not present

## 2017-05-16 DIAGNOSIS — H532 Diplopia: Secondary | ICD-10-CM | POA: Diagnosis not present

## 2017-05-16 DIAGNOSIS — M50223 Other cervical disc displacement at C6-C7 level: Secondary | ICD-10-CM | POA: Diagnosis not present

## 2017-05-18 DIAGNOSIS — M50223 Other cervical disc displacement at C6-C7 level: Secondary | ICD-10-CM | POA: Diagnosis not present

## 2017-05-18 DIAGNOSIS — M542 Cervicalgia: Secondary | ICD-10-CM | POA: Diagnosis not present

## 2017-05-18 DIAGNOSIS — M5127 Other intervertebral disc displacement, lumbosacral region: Secondary | ICD-10-CM | POA: Diagnosis not present

## 2017-05-18 DIAGNOSIS — M791 Myalgia, unspecified site: Secondary | ICD-10-CM | POA: Diagnosis not present

## 2017-05-18 DIAGNOSIS — M50322 Other cervical disc degeneration at C5-C6 level: Secondary | ICD-10-CM | POA: Diagnosis not present

## 2017-05-23 DIAGNOSIS — M542 Cervicalgia: Secondary | ICD-10-CM | POA: Diagnosis not present

## 2017-05-23 DIAGNOSIS — M5127 Other intervertebral disc displacement, lumbosacral region: Secondary | ICD-10-CM | POA: Diagnosis not present

## 2017-05-23 DIAGNOSIS — M50223 Other cervical disc displacement at C6-C7 level: Secondary | ICD-10-CM | POA: Diagnosis not present

## 2017-05-23 DIAGNOSIS — M50322 Other cervical disc degeneration at C5-C6 level: Secondary | ICD-10-CM | POA: Diagnosis not present

## 2017-05-23 DIAGNOSIS — M791 Myalgia, unspecified site: Secondary | ICD-10-CM | POA: Diagnosis not present

## 2017-05-25 DIAGNOSIS — M791 Myalgia, unspecified site: Secondary | ICD-10-CM | POA: Diagnosis not present

## 2017-05-25 DIAGNOSIS — M50223 Other cervical disc displacement at C6-C7 level: Secondary | ICD-10-CM | POA: Diagnosis not present

## 2017-05-25 DIAGNOSIS — M50322 Other cervical disc degeneration at C5-C6 level: Secondary | ICD-10-CM | POA: Diagnosis not present

## 2017-05-25 DIAGNOSIS — M5127 Other intervertebral disc displacement, lumbosacral region: Secondary | ICD-10-CM | POA: Diagnosis not present

## 2017-05-30 DIAGNOSIS — M5127 Other intervertebral disc displacement, lumbosacral region: Secondary | ICD-10-CM | POA: Diagnosis not present

## 2017-05-30 DIAGNOSIS — M791 Myalgia, unspecified site: Secondary | ICD-10-CM | POA: Diagnosis not present

## 2017-05-30 DIAGNOSIS — M50322 Other cervical disc degeneration at C5-C6 level: Secondary | ICD-10-CM | POA: Diagnosis not present

## 2017-05-30 DIAGNOSIS — M50223 Other cervical disc displacement at C6-C7 level: Secondary | ICD-10-CM | POA: Diagnosis not present

## 2017-06-01 DIAGNOSIS — Z713 Dietary counseling and surveillance: Secondary | ICD-10-CM | POA: Diagnosis not present

## 2017-06-01 DIAGNOSIS — M50223 Other cervical disc displacement at C6-C7 level: Secondary | ICD-10-CM | POA: Diagnosis not present

## 2017-06-01 DIAGNOSIS — M791 Myalgia, unspecified site: Secondary | ICD-10-CM | POA: Diagnosis not present

## 2017-06-01 DIAGNOSIS — M5127 Other intervertebral disc displacement, lumbosacral region: Secondary | ICD-10-CM | POA: Diagnosis not present

## 2017-06-01 DIAGNOSIS — E669 Obesity, unspecified: Secondary | ICD-10-CM | POA: Diagnosis not present

## 2017-06-01 DIAGNOSIS — M50322 Other cervical disc degeneration at C5-C6 level: Secondary | ICD-10-CM | POA: Diagnosis not present

## 2017-06-01 DIAGNOSIS — E7800 Pure hypercholesterolemia, unspecified: Secondary | ICD-10-CM | POA: Diagnosis not present

## 2017-06-06 DIAGNOSIS — M791 Myalgia, unspecified site: Secondary | ICD-10-CM | POA: Diagnosis not present

## 2017-06-06 DIAGNOSIS — M5127 Other intervertebral disc displacement, lumbosacral region: Secondary | ICD-10-CM | POA: Diagnosis not present

## 2017-06-06 DIAGNOSIS — M50223 Other cervical disc displacement at C6-C7 level: Secondary | ICD-10-CM | POA: Diagnosis not present

## 2017-06-06 DIAGNOSIS — M50322 Other cervical disc degeneration at C5-C6 level: Secondary | ICD-10-CM | POA: Diagnosis not present

## 2017-06-13 DIAGNOSIS — M5127 Other intervertebral disc displacement, lumbosacral region: Secondary | ICD-10-CM | POA: Diagnosis not present

## 2017-06-13 DIAGNOSIS — M50223 Other cervical disc displacement at C6-C7 level: Secondary | ICD-10-CM | POA: Diagnosis not present

## 2017-06-13 DIAGNOSIS — M50322 Other cervical disc degeneration at C5-C6 level: Secondary | ICD-10-CM | POA: Diagnosis not present

## 2017-06-13 DIAGNOSIS — M791 Myalgia, unspecified site: Secondary | ICD-10-CM | POA: Diagnosis not present

## 2017-06-14 DIAGNOSIS — M50223 Other cervical disc displacement at C6-C7 level: Secondary | ICD-10-CM | POA: Diagnosis not present

## 2017-06-14 DIAGNOSIS — M50322 Other cervical disc degeneration at C5-C6 level: Secondary | ICD-10-CM | POA: Diagnosis not present

## 2017-06-14 DIAGNOSIS — M5127 Other intervertebral disc displacement, lumbosacral region: Secondary | ICD-10-CM | POA: Diagnosis not present

## 2017-06-14 DIAGNOSIS — M791 Myalgia, unspecified site: Secondary | ICD-10-CM | POA: Diagnosis not present

## 2017-06-20 DIAGNOSIS — M5127 Other intervertebral disc displacement, lumbosacral region: Secondary | ICD-10-CM | POA: Diagnosis not present

## 2017-06-20 DIAGNOSIS — M791 Myalgia, unspecified site: Secondary | ICD-10-CM | POA: Diagnosis not present

## 2017-06-20 DIAGNOSIS — M50322 Other cervical disc degeneration at C5-C6 level: Secondary | ICD-10-CM | POA: Diagnosis not present

## 2017-06-20 DIAGNOSIS — M50223 Other cervical disc displacement at C6-C7 level: Secondary | ICD-10-CM | POA: Diagnosis not present

## 2017-06-29 DIAGNOSIS — Z713 Dietary counseling and surveillance: Secondary | ICD-10-CM | POA: Diagnosis not present

## 2017-06-29 DIAGNOSIS — E7800 Pure hypercholesterolemia, unspecified: Secondary | ICD-10-CM | POA: Diagnosis not present

## 2017-06-29 DIAGNOSIS — E669 Obesity, unspecified: Secondary | ICD-10-CM | POA: Diagnosis not present

## 2017-07-06 DIAGNOSIS — L570 Actinic keratosis: Secondary | ICD-10-CM | POA: Diagnosis not present

## 2017-07-27 DIAGNOSIS — E7800 Pure hypercholesterolemia, unspecified: Secondary | ICD-10-CM | POA: Diagnosis not present

## 2017-07-27 DIAGNOSIS — Z713 Dietary counseling and surveillance: Secondary | ICD-10-CM | POA: Diagnosis not present

## 2017-07-27 DIAGNOSIS — E669 Obesity, unspecified: Secondary | ICD-10-CM | POA: Diagnosis not present

## 2017-08-15 DIAGNOSIS — M5126 Other intervertebral disc displacement, lumbar region: Secondary | ICD-10-CM | POA: Diagnosis not present

## 2017-08-15 DIAGNOSIS — M9903 Segmental and somatic dysfunction of lumbar region: Secondary | ICD-10-CM | POA: Diagnosis not present

## 2017-08-15 DIAGNOSIS — M9905 Segmental and somatic dysfunction of pelvic region: Secondary | ICD-10-CM | POA: Diagnosis not present

## 2017-08-15 DIAGNOSIS — M9904 Segmental and somatic dysfunction of sacral region: Secondary | ICD-10-CM | POA: Diagnosis not present

## 2017-08-18 DIAGNOSIS — M9904 Segmental and somatic dysfunction of sacral region: Secondary | ICD-10-CM | POA: Diagnosis not present

## 2017-08-18 DIAGNOSIS — M5126 Other intervertebral disc displacement, lumbar region: Secondary | ICD-10-CM | POA: Diagnosis not present

## 2017-08-18 DIAGNOSIS — M9905 Segmental and somatic dysfunction of pelvic region: Secondary | ICD-10-CM | POA: Diagnosis not present

## 2017-08-18 DIAGNOSIS — M9903 Segmental and somatic dysfunction of lumbar region: Secondary | ICD-10-CM | POA: Diagnosis not present

## 2017-08-31 DIAGNOSIS — E669 Obesity, unspecified: Secondary | ICD-10-CM | POA: Diagnosis not present

## 2017-08-31 DIAGNOSIS — Z713 Dietary counseling and surveillance: Secondary | ICD-10-CM | POA: Diagnosis not present

## 2017-08-31 DIAGNOSIS — E7800 Pure hypercholesterolemia, unspecified: Secondary | ICD-10-CM | POA: Diagnosis not present

## 2017-09-01 DIAGNOSIS — M9904 Segmental and somatic dysfunction of sacral region: Secondary | ICD-10-CM | POA: Diagnosis not present

## 2017-09-01 DIAGNOSIS — M9903 Segmental and somatic dysfunction of lumbar region: Secondary | ICD-10-CM | POA: Diagnosis not present

## 2017-09-01 DIAGNOSIS — M5126 Other intervertebral disc displacement, lumbar region: Secondary | ICD-10-CM | POA: Diagnosis not present

## 2017-09-01 DIAGNOSIS — M9905 Segmental and somatic dysfunction of pelvic region: Secondary | ICD-10-CM | POA: Diagnosis not present

## 2017-09-14 DIAGNOSIS — M5126 Other intervertebral disc displacement, lumbar region: Secondary | ICD-10-CM | POA: Diagnosis not present

## 2017-09-14 DIAGNOSIS — M9903 Segmental and somatic dysfunction of lumbar region: Secondary | ICD-10-CM | POA: Diagnosis not present

## 2017-09-14 DIAGNOSIS — M9904 Segmental and somatic dysfunction of sacral region: Secondary | ICD-10-CM | POA: Diagnosis not present

## 2017-09-14 DIAGNOSIS — M9905 Segmental and somatic dysfunction of pelvic region: Secondary | ICD-10-CM | POA: Diagnosis not present

## 2017-10-04 DIAGNOSIS — M9903 Segmental and somatic dysfunction of lumbar region: Secondary | ICD-10-CM | POA: Diagnosis not present

## 2017-10-04 DIAGNOSIS — M5126 Other intervertebral disc displacement, lumbar region: Secondary | ICD-10-CM | POA: Diagnosis not present

## 2017-10-04 DIAGNOSIS — M9905 Segmental and somatic dysfunction of pelvic region: Secondary | ICD-10-CM | POA: Diagnosis not present

## 2017-10-04 DIAGNOSIS — M9904 Segmental and somatic dysfunction of sacral region: Secondary | ICD-10-CM | POA: Diagnosis not present

## 2017-10-05 DIAGNOSIS — Z713 Dietary counseling and surveillance: Secondary | ICD-10-CM | POA: Diagnosis not present

## 2017-10-05 DIAGNOSIS — E7800 Pure hypercholesterolemia, unspecified: Secondary | ICD-10-CM | POA: Diagnosis not present

## 2017-10-05 DIAGNOSIS — E669 Obesity, unspecified: Secondary | ICD-10-CM | POA: Diagnosis not present

## 2017-10-13 DIAGNOSIS — M5126 Other intervertebral disc displacement, lumbar region: Secondary | ICD-10-CM | POA: Diagnosis not present

## 2017-10-13 DIAGNOSIS — M9903 Segmental and somatic dysfunction of lumbar region: Secondary | ICD-10-CM | POA: Diagnosis not present

## 2017-10-13 DIAGNOSIS — M9905 Segmental and somatic dysfunction of pelvic region: Secondary | ICD-10-CM | POA: Diagnosis not present

## 2017-10-13 DIAGNOSIS — M9904 Segmental and somatic dysfunction of sacral region: Secondary | ICD-10-CM | POA: Diagnosis not present

## 2017-10-26 ENCOUNTER — Telehealth: Payer: Self-pay | Admitting: Cardiology

## 2017-10-26 NOTE — Telephone Encounter (Signed)
New Message   Patient has an appointment scheduled for 08/19. He is wondering does he need to come in prior for lab work. Please call to discuss or the patient says you can send him a message through mychart.

## 2017-10-26 NOTE — Telephone Encounter (Signed)
Left message to call back  

## 2017-10-27 ENCOUNTER — Other Ambulatory Visit: Payer: Self-pay

## 2017-10-27 DIAGNOSIS — E785 Hyperlipidemia, unspecified: Secondary | ICD-10-CM

## 2017-10-27 DIAGNOSIS — I10 Essential (primary) hypertension: Secondary | ICD-10-CM

## 2017-10-27 NOTE — Telephone Encounter (Signed)
Labs ordered, mychart message sent.

## 2017-10-30 ENCOUNTER — Ambulatory Visit: Payer: BLUE CROSS/BLUE SHIELD | Admitting: Cardiology

## 2017-11-02 DIAGNOSIS — Z713 Dietary counseling and surveillance: Secondary | ICD-10-CM | POA: Diagnosis not present

## 2017-11-02 DIAGNOSIS — E669 Obesity, unspecified: Secondary | ICD-10-CM | POA: Diagnosis not present

## 2017-11-02 DIAGNOSIS — E7800 Pure hypercholesterolemia, unspecified: Secondary | ICD-10-CM | POA: Diagnosis not present

## 2017-11-07 ENCOUNTER — Other Ambulatory Visit: Payer: Self-pay | Admitting: *Deleted

## 2017-11-07 DIAGNOSIS — I10 Essential (primary) hypertension: Secondary | ICD-10-CM | POA: Diagnosis not present

## 2017-11-07 DIAGNOSIS — E785 Hyperlipidemia, unspecified: Secondary | ICD-10-CM

## 2017-11-07 LAB — LIPID PANEL
Chol/HDL Ratio: 3.1 ratio (ref 0.0–5.0)
Cholesterol, Total: 166 mg/dL (ref 100–199)
HDL: 54 mg/dL (ref >39)
LDL Calculated: 91 mg/dL (ref 0–99)
Triglycerides: 105 mg/dL (ref 0–149)
VLDL Cholesterol Cal: 21 mg/dL (ref 5–40)

## 2017-11-07 LAB — BASIC METABOLIC PANEL
BUN/Creatinine Ratio: 18 (ref 9–20)
BUN: 19 mg/dL (ref 6–24)
CO2: 22 mmol/L (ref 20–29)
Calcium: 9.7 mg/dL (ref 8.7–10.2)
Chloride: 101 mmol/L (ref 96–106)
Creatinine, Ser: 1.05 mg/dL (ref 0.76–1.27)
GFR calc Af Amer: 92 mL/min/{1.73_m2} (ref >59)
GFR calc non Af Amer: 80 mL/min/{1.73_m2} (ref >59)
Glucose: 108 mg/dL — ABNORMAL HIGH (ref 65–99)
Potassium: 4.3 mmol/L (ref 3.5–5.2)
Sodium: 140 mmol/L (ref 134–144)

## 2017-11-07 LAB — HEPATIC FUNCTION PANEL
ALT: 32 IU/L (ref 0–44)
AST: 19 IU/L (ref 0–40)
Albumin: 4.9 g/dL (ref 3.5–5.5)
Alkaline Phosphatase: 62 IU/L (ref 39–117)
Bilirubin Total: 0.6 mg/dL (ref 0.0–1.2)
Bilirubin, Direct: 0.14 mg/dL (ref 0.00–0.40)
Total Protein: 6.9 g/dL (ref 6.0–8.5)

## 2017-11-12 NOTE — Progress Notes (Signed)
Cardiology Office Note   Date:  11/13/2017   ID:  Gerrit, Rafalski 1960/10/06, MRN [id number]  PCP:  Biagio Borg, MD  Cardiologist:   Minus Breeding, MD   Chief Complaint  Patient presents with  . Coronary Calcium      History of Present Illness: Brett Griffith is a 57 y.o. male who presents for evaluation of elevated coronary calcium.   I saw him in  2016 for evaluation of dyspnea.  He had extensive coronary calcium.  However, he had a negative stress echo in Jan of 2017 and a negative POET (Plain Old Exercise Treadmill) last year.    Since then he has done well.  He went to [place] and had no problem with lots of hikes.  The patient denies any new symptoms such as chest discomfort, neck or arm discomfort. There has been no new shortness of breath, PND or orthopnea. There have been no reported palpitations, presyncope or syncope.      Past Medical History:  Diagnosis Date  . Allergy   . History of kidney stones   . HYPERLIPIDEMIA 01/08/2009  . Impaired glucose tolerance 09/21/2010  . NEPHROLITHIASIS, HX OF 01/08/2009  . Shingles   . TIA (transient ischemic attack)     Past Surgical History:  Procedure Laterality Date  . INSERTION OF MESH N/A 08/10/2016   Procedure: INSERTION OF MESH;  Surgeon: Clovis Riley, MD;  Location: [facility];  Service: General;  Laterality: N/A;  . s/p ganglion cyst     right wrist  . s/p pilonidal cystectomy    . UMBILICAL HERNIA REPAIR N/A 08/10/2016   Procedure: UMBILICAL HERNIA REPAIR;  Surgeon: Clovis Riley, MD;  Location: [facility];  Service: General;  Laterality: N/A;  . WISDOM TOOTH EXTRACTION       Current Outpatient Medications  Medication Sig Dispense Refill  . aspirin EC 81 MG tablet Take 1 tablet (81 mg total) by mouth daily. 90 tablet 11  . Coenzyme Q10 (CO Q-10 PO) Take 1 tablet by mouth daily.    . LUTEIN-ZEAXANTHIN PO Take by mouth.    . Multiple Vitamin (MULTIVITAMIN) tablet Take  1 tablet by mouth daily.    Marland Kitchen OVER THE COUNTER MEDICATION Tumeric 100 mg. One tablet daily.    . rosuvastatin (CRESTOR) 40 MG tablet Take 1 tablet (40 mg total) daily by mouth. 90 tablet 3   Current Facility-Administered Medications  Medication Dose Route Frequency Provider Last Rate Last Dose  . 0.9 %  sodium chloride infusion  500 mL Intravenous Once Irene Shipper, MD        Allergies:   Ezetimibe-simvastatin    ROS:  Please see the history of present illness.   Otherwise, review of systems are positive for none.   All other systems are reviewed and negative.    PHYSICAL EXAM: VS:  BP 140/74 (BP Location: Left Arm, Patient Position: Sitting)   Pulse 75   Ht 5\' 7"  (1.702 m)   Wt 222 lb (100.7 kg)   BMI 34.77 kg/m  , BMI Body mass index is 34.77 kg/m.  GENERAL:  Well appearing NECK:  No jugular venous distention, waveform within normal limits, carotid upstroke brisk and symmetric, no bruits, no thyromegaly LUNGS:  Clear to auscultation bilaterally CHEST:  Unremarkable HEART:  PMI not displaced or sustained,S1 and S2 within normal limits, no S3, no S4, no clicks, no rubs, no murmurs ABD:  Flat, positive bowel  sounds normal in frequency in pitch, no bruits, no rebound, no guarding, no midline pulsatile mass, no hepatomegaly, no splenomegaly EXT:  2 plus pulses throughout, no edema, no cyanosis no clubbing   EKG:  EKG is  ordered today. The ekg ordered today demonstrates sinus rhythm, rate 75, axis within normal limits, intervals within normal limits, no acute ST-T wave changes.   Recent Labs: 01/27/2017: Hemoglobin 15.6; Platelets 245.0; TSH 1.07 11/07/2017: ALT 32; BUN 19; Creatinine, Ser 1.05; Potassium 4.3; Sodium 140    Lipid Panel    Component Value Date/Time   CHOL 166 11/07/2017 0824   TRIG 105 11/07/2017 0824   HDL 54 11/07/2017 0824   CHOLHDL 3.1 11/07/2017 0824   CHOLHDL 3 01/27/2017 0736   VLDL 8.6 01/27/2017 0736   LDLCALC 91 11/07/2017 0824   LDLDIRECT 66  10/14/2016 0806   LDLDIRECT 123 03/31/2015 0838      Wt Readings from Last 3 Encounters:  11/13/17 222 lb (100.7 kg)  04/25/17 224 lb (101.6 kg)  04/11/17 224 lb (101.6 kg)      Other studies Reviewed: Additional studies/ records that were reviewed today include: Labs Review of the above records demonstrates:     ASSESSMENT AND PLAN:   CORONARY CALICIUM:   He had no evidence for ischemia on the POET (Plain Old Exercise Treadmill) last year.    He has been very active without symptoms.  No further cardiac work up.  No change in therapy.   DYSLIPIDEMIA:    His LDL was elevated last week and we talked about this.  This was likely related to dietary indiscretion.  He is going to get it checked again in November and I be happy to review this.  At this point no change in therapy.   HTN:   My goal for him is 120s/70s.  He reports he is usually at this level of diarrhea.  No change in therapy.   OVERWEIGHT:    I again  gave him the goal of losing about 15 pounds.   Current medicines are reviewed at length with the patient today.  The patient does not have concerns regarding medicines.  The following changes have been made:  none  Labs/ tests ordered today include:  none  Orders Placed This Encounter  Procedures  . EKG 12-Lead     Disposition:   FU with me in one year.    Signed, Minus Breeding, MD  11/13/2017 9:05 PM    [facility]

## 2017-11-13 ENCOUNTER — Ambulatory Visit: Payer: BLUE CROSS/BLUE SHIELD | Admitting: Cardiology

## 2017-11-13 ENCOUNTER — Encounter: Payer: Self-pay | Admitting: Cardiology

## 2017-11-13 VITALS — BP 140/74 | HR 75 | Ht 67.0 in | Wt 222.0 lb

## 2017-11-13 DIAGNOSIS — E785 Hyperlipidemia, unspecified: Secondary | ICD-10-CM

## 2017-11-13 DIAGNOSIS — R931 Abnormal findings on diagnostic imaging of heart and coronary circulation: Secondary | ICD-10-CM | POA: Diagnosis not present

## 2017-11-13 DIAGNOSIS — I10 Essential (primary) hypertension: Secondary | ICD-10-CM

## 2017-11-13 NOTE — Patient Instructions (Addendum)

## 2017-11-17 ENCOUNTER — Telehealth: Payer: Self-pay | Admitting: *Deleted

## 2017-11-17 ENCOUNTER — Telehealth: Payer: Self-pay | Admitting: Cardiology

## 2017-11-17 NOTE — Telephone Encounter (Signed)
Spoke with pt who states he already had an appointment with Dr. Percival Spanish on 11/12/17 to discuss lab and treatment plan.

## 2017-11-17 NOTE — Telephone Encounter (Signed)
Leave message for pt to call back 

## 2017-11-17 NOTE — Telephone Encounter (Signed)
-----   Message from Minus Breeding, MD sent at 11/08/2017  7:54 AM EDT ----- His LDL is not less than 70.  I would suggest Xetia 10 mg one po daily disp number 90 with 3 refills.  Call [name] with the results and send results to Biagio Borg, MD

## 2017-11-17 NOTE — Telephone Encounter (Signed)
° ° °  Please return call to patient °

## 2017-11-20 ENCOUNTER — Telehealth: Payer: Self-pay | Admitting: Cardiology

## 2017-11-20 ENCOUNTER — Telehealth: Payer: Self-pay | Admitting: *Deleted

## 2017-11-20 DIAGNOSIS — D229 Melanocytic nevi, unspecified: Secondary | ICD-10-CM | POA: Diagnosis not present

## 2017-11-20 DIAGNOSIS — L570 Actinic keratosis: Secondary | ICD-10-CM | POA: Diagnosis not present

## 2017-11-20 MED ORDER — EZETIMIBE 10 MG PO TABS: 10.0000 mg | ORAL_TABLET | Freq: Every day | ORAL | 3 refills | Status: DC

## 2017-11-20 NOTE — Telephone Encounter (Signed)
-----   Message from Minus Breeding, MD sent at 11/08/2017  7:54 AM EDT ----- His LDL is not less than 70.  I would suggest Xetia 10 mg one po daily disp number 90 with 3 refills.  Call Mr. Rathert with the results and send results to Biagio Borg, MD

## 2017-11-20 NOTE — Telephone Encounter (Signed)
Follow up: ° ° °Patient returning call back °

## 2017-11-20 NOTE — Telephone Encounter (Signed)
Leave message on pt voicemail mail,letting him know rx for zetia 10 mg was send into pharmacy and to give office a call if he have any question.

## 2017-11-21 NOTE — Telephone Encounter (Signed)
Spoke with pt

## 2017-11-30 DIAGNOSIS — Z713 Dietary counseling and surveillance: Secondary | ICD-10-CM | POA: Diagnosis not present

## 2017-11-30 DIAGNOSIS — E7800 Pure hypercholesterolemia, unspecified: Secondary | ICD-10-CM | POA: Diagnosis not present

## 2017-11-30 DIAGNOSIS — E669 Obesity, unspecified: Secondary | ICD-10-CM | POA: Diagnosis not present

## 2017-12-12 DIAGNOSIS — Z23 Encounter for immunization: Secondary | ICD-10-CM | POA: Diagnosis not present

## 2017-12-28 DIAGNOSIS — M9904 Segmental and somatic dysfunction of sacral region: Secondary | ICD-10-CM | POA: Diagnosis not present

## 2017-12-28 DIAGNOSIS — M5126 Other intervertebral disc displacement, lumbar region: Secondary | ICD-10-CM | POA: Diagnosis not present

## 2017-12-28 DIAGNOSIS — M9903 Segmental and somatic dysfunction of lumbar region: Secondary | ICD-10-CM | POA: Diagnosis not present

## 2017-12-28 DIAGNOSIS — M9905 Segmental and somatic dysfunction of pelvic region: Secondary | ICD-10-CM | POA: Diagnosis not present

## 2018-01-04 DIAGNOSIS — Z713 Dietary counseling and surveillance: Secondary | ICD-10-CM | POA: Diagnosis not present

## 2018-01-04 DIAGNOSIS — E7800 Pure hypercholesterolemia, unspecified: Secondary | ICD-10-CM | POA: Diagnosis not present

## 2018-01-04 DIAGNOSIS — E669 Obesity, unspecified: Secondary | ICD-10-CM | POA: Diagnosis not present

## 2018-01-05 DIAGNOSIS — M9904 Segmental and somatic dysfunction of sacral region: Secondary | ICD-10-CM | POA: Diagnosis not present

## 2018-01-05 DIAGNOSIS — M9905 Segmental and somatic dysfunction of pelvic region: Secondary | ICD-10-CM | POA: Diagnosis not present

## 2018-01-05 DIAGNOSIS — M5126 Other intervertebral disc displacement, lumbar region: Secondary | ICD-10-CM | POA: Diagnosis not present

## 2018-01-05 DIAGNOSIS — M9903 Segmental and somatic dysfunction of lumbar region: Secondary | ICD-10-CM | POA: Diagnosis not present

## 2018-02-01 DIAGNOSIS — Z713 Dietary counseling and surveillance: Secondary | ICD-10-CM | POA: Diagnosis not present

## 2018-02-01 DIAGNOSIS — E669 Obesity, unspecified: Secondary | ICD-10-CM | POA: Diagnosis not present

## 2018-02-01 DIAGNOSIS — E7800 Pure hypercholesterolemia, unspecified: Secondary | ICD-10-CM | POA: Diagnosis not present

## 2018-02-07 ENCOUNTER — Other Ambulatory Visit (INDEPENDENT_AMBULATORY_CARE_PROVIDER_SITE_OTHER): Payer: BLUE CROSS/BLUE SHIELD

## 2018-02-07 DIAGNOSIS — Z114 Encounter for screening for human immunodeficiency virus [HIV]: Secondary | ICD-10-CM

## 2018-02-07 DIAGNOSIS — Z0000 Encounter for general adult medical examination without abnormal findings: Secondary | ICD-10-CM

## 2018-02-07 DIAGNOSIS — R7302 Impaired glucose tolerance (oral): Secondary | ICD-10-CM

## 2018-02-07 LAB — CBC WITH DIFFERENTIAL/PLATELET
Basophils Absolute: 0 10*3/uL (ref 0.0–0.1)
Basophils Relative: 0.6 % (ref 0.0–3.0)
Eosinophils Absolute: 0.2 10*3/uL (ref 0.0–0.7)
Eosinophils Relative: 3.6 % (ref 0.0–5.0)
HCT: 47.9 % (ref 39.0–52.0)
Hemoglobin: 16.2 g/dL (ref 13.0–17.0)
Lymphocytes Relative: 33.3 % (ref 12.0–46.0)
Lymphs Abs: 1.9 10*3/uL (ref 0.7–4.0)
MCHC: 33.8 g/dL (ref 30.0–36.0)
MCV: 90.3 fl (ref 78.0–100.0)
Monocytes Absolute: 0.5 10*3/uL (ref 0.1–1.0)
Monocytes Relative: 9.7 % (ref 3.0–12.0)
Neutro Abs: 3 10*3/uL (ref 1.4–7.7)
Neutrophils Relative %: 52.8 % (ref 43.0–77.0)
Platelets: 265 10*3/uL (ref 150.0–400.0)
RBC: 5.3 Mil/uL (ref 4.22–5.81)
RDW: 13.4 % (ref 11.5–15.5)
WBC: 5.7 10*3/uL (ref 4.0–10.5)

## 2018-02-07 LAB — URINALYSIS, ROUTINE W REFLEX MICROSCOPIC
Bilirubin Urine: NEGATIVE
Hgb urine dipstick: NEGATIVE
Ketones, ur: NEGATIVE
Leukocytes, UA: NEGATIVE
Nitrite: NEGATIVE
RBC / HPF: NONE SEEN (ref 0–?)
Specific Gravity, Urine: 1.01 (ref 1.000–1.030)
Total Protein, Urine: NEGATIVE
Urine Glucose: NEGATIVE
Urobilinogen, UA: 0.2 (ref 0.0–1.0)
WBC, UA: NONE SEEN (ref 0–?)
pH: 7 (ref 5.0–8.0)

## 2018-02-07 LAB — PSA: PSA: 1.3 ng/mL (ref 0.10–4.00)

## 2018-02-07 LAB — HEPATIC FUNCTION PANEL
ALT: 43 U/L (ref 0–53)
AST: 25 U/L (ref 0–37)
Albumin: 4.8 g/dL (ref 3.5–5.2)
Alkaline Phosphatase: 48 U/L (ref 39–117)
Bilirubin, Direct: 0.2 mg/dL (ref 0.0–0.3)
Total Bilirubin: 0.8 mg/dL (ref 0.2–1.2)
Total Protein: 7.2 g/dL (ref 6.0–8.3)

## 2018-02-07 LAB — BASIC METABOLIC PANEL
BUN: 14 mg/dL (ref 6–23)
CO2: 29 mEq/L (ref 19–32)
Calcium: 10 mg/dL (ref 8.4–10.5)
Chloride: 103 mEq/L (ref 96–112)
Creatinine, Ser: 1.07 mg/dL (ref 0.40–1.50)
GFR: 76.11 mL/min (ref >60.00)
Glucose, Bld: 120 mg/dL — ABNORMAL HIGH (ref 70–99)
Potassium: 4.3 mEq/L (ref 3.5–5.1)
Sodium: 140 mEq/L (ref 135–145)

## 2018-02-07 LAB — LIPID PANEL
Cholesterol: 114 mg/dL (ref 0–200)
HDL: 52.5 mg/dL (ref >39.00)
LDL Cholesterol: 45 mg/dL (ref 0–99)
NonHDL: 61.95
Total CHOL/HDL Ratio: 2
Triglycerides: 86 mg/dL (ref 0.0–149.0)
VLDL: 17.2 mg/dL (ref 0.0–40.0)

## 2018-02-07 LAB — TSH: TSH: 1.9 u[IU]/mL (ref 0.35–4.50)

## 2018-02-07 LAB — HEMOGLOBIN A1C: Hgb A1c MFr Bld: 6.1 % (ref 4.6–6.5)

## 2018-02-08 LAB — HIV ANTIBODY (ROUTINE TESTING W REFLEX): HIV 1&2 Ab, 4th Generation: NONREACTIVE

## 2018-02-09 ENCOUNTER — Ambulatory Visit (INDEPENDENT_AMBULATORY_CARE_PROVIDER_SITE_OTHER): Payer: BLUE CROSS/BLUE SHIELD | Admitting: Internal Medicine

## 2018-02-09 ENCOUNTER — Encounter: Payer: Self-pay | Admitting: Internal Medicine

## 2018-02-09 VITALS — BP 126/78 | HR 68 | Temp 97.9°F | Ht 67.0 in | Wt 217.0 lb

## 2018-02-09 DIAGNOSIS — R7302 Impaired glucose tolerance (oral): Secondary | ICD-10-CM

## 2018-02-09 DIAGNOSIS — Z0000 Encounter for general adult medical examination without abnormal findings: Secondary | ICD-10-CM | POA: Diagnosis not present

## 2018-02-09 DIAGNOSIS — Z23 Encounter for immunization: Secondary | ICD-10-CM

## 2018-02-09 MED ORDER — ROSUVASTATIN CALCIUM 40 MG PO TABS: 40.0000 mg | ORAL_TABLET | Freq: Every day | ORAL | 3 refills | Status: DC

## 2018-02-09 MED ORDER — EZETIMIBE 10 MG PO TABS: 10.0000 mg | ORAL_TABLET | Freq: Every day | ORAL | 3 refills | Status: DC

## 2018-02-09 NOTE — Assessment & Plan Note (Signed)

## 2018-02-09 NOTE — Assessment & Plan Note (Signed)
stable overall by history and exam, recent data reviewed with pt, and pt to continue medical treatment as before,  to f/u any worsening symptoms or concerns  

## 2018-02-09 NOTE — Patient Instructions (Addendum)
You had the Tdap tetanus shot today  Please continue all other medications as before, and refills have been done if requested.  Please have the pharmacy call with any other refills you may need.  Please continue your efforts at being more active, low cholesterol diet, and weight control.  You are otherwise up to date with prevention measures today.  Please keep your appointments with your specialists as you may have planned  Please return in 1 year for your yearly visit, or sooner if needed, with Lab testing done 3-5 days before  

## 2018-02-09 NOTE — Progress Notes (Signed)
Subjective:    Patient ID: Brett Griffith, male    DOB: [birth year], 57 y.o.   MRN: [id number]  HPI   Here for wellness and f/u;  Overall doing ok;  Pt denies Chest pain, worsening SOB, DOE, wheezing, orthopnea, PND, worsening LE edema, palpitations, dizziness or syncope.  Pt denies neurological change such as new headache, facial or extremity weakness.  Pt denies polydipsia, polyuria, or low sugar symptoms. Pt states overall good compliance with treatment and medications, good tolerability, and has been trying to follow appropriate diet.  Pt denies worsening depressive symptoms, suicidal ideation or panic. No fever, night sweats, wt loss, loss of appetite, or other constitutional symptoms.  Pt states good ability with ADL's, has low fall risk, home safety reviewed and adequate, no other significant changes in hearing or vision, and daily active with exercise.  Still says he needs to lose 15 lbs. No new complaints Past Medical History:  Diagnosis Date  . Allergy   . History of kidney stones   . HYPERLIPIDEMIA 01/08/2009  . Impaired glucose tolerance 09/21/2010  . NEPHROLITHIASIS, HX OF 01/08/2009  . Shingles   . TIA (transient ischemic attack)    Past Surgical History:  Procedure Laterality Date  . INSERTION OF MESH N/A 08/10/2016   Procedure: INSERTION OF MESH;  Surgeon: Clovis Riley, MD;  Location: [place] [facility];  Service: General;  Laterality: N/A;  . s/p ganglion cyst     right wrist  . s/p pilonidal cystectomy    . UMBILICAL HERNIA REPAIR N/A 08/10/2016   Procedure: UMBILICAL HERNIA REPAIR;  Surgeon: Clovis Riley, MD;  Location: [place];  Service: General;  Laterality: N/A;  . WISDOM TOOTH EXTRACTION      reports that he has never smoked. He has never used smokeless tobacco. He reports that he drinks about 2.0 standard drinks of alcohol per week. He reports that he does not use drugs. family history includes CAD (age of onset: 55) in his brother;  CAD (age of onset: 37) in his father; Hypertension in his brother, father, and mother; Peripheral vascular disease (age of onset: 82) in his mother. Allergies  Allergen Reactions  . Ezetimibe-Simvastatin     REACTION: more emotional   Current Outpatient Medications on File Prior to Visit  Medication Sig Dispense Refill  . aspirin EC 81 MG tablet Take 1 tablet (81 mg total) by mouth daily. 90 tablet 11  . Coenzyme Q10 (CO Q-10 PO) Take 1 tablet by mouth daily.    . LUTEIN-ZEAXANTHIN PO Take by mouth.    . Multiple Vitamin (MULTIVITAMIN) tablet Take 1 tablet by mouth daily.    Marland Kitchen OVER THE COUNTER MEDICATION Tumeric 100 mg. One tablet daily.     No current facility-administered medications on file prior to visit.    Review of Systems Constitutional: Negative for other unusual diaphoresis, sweats, appetite or weight changes HENT: Negative for other worsening hearing loss, ear pain, facial swelling, mouth sores or neck stiffness.   Eyes: Negative for other worsening pain, redness or other visual disturbance.  Respiratory: Negative for other stridor or swelling Cardiovascular: Negative for other palpitations or other chest pain  Gastrointestinal: Negative for worsening diarrhea or loose stools, blood in stool, distention or other pain Genitourinary: Negative for hematuria, flank pain or other change in urine volume.  Musculoskeletal: Negative for myalgias or other joint swelling.  Skin: Negative for other color change, or other wound or worsening drainage.  Neurological: Negative for other  syncope or numbness. Hematological: Negative for other adenopathy or swelling Psychiatric/Behavioral: Negative for hallucinations, other worsening agitation, SI, self-injury, or new decreased concentration All other system neg per pt    Objective:   Physical Exam BP 126/78   Pulse 68   Temp 97.9 F (36.6 C) (Oral)   Ht 5\' 7"  (1.702 m)   Wt 217 lb (98.4 kg)   SpO2 95%   BMI 33.99 kg/m  VS noted,  obese Constitutional: Pt is oriented to person, place, and time. Appears well-developed and well-nourished, in no significant distress and comfortable Head: Normocephalic and atraumatic  Eyes: Conjunctivae and EOM are normal. Pupils are equal, round, and reactive to light Right Ear: External ear normal without discharge Left Ear: External ear normal without discharge Nose: Nose without discharge or deformity Mouth/Throat: Oropharynx is without other ulcerations and moist  Neck: Normal range of motion. Neck supple. No JVD present. No tracheal deviation present or significant neck LA or mass Cardiovascular: Normal rate, regular rhythm, normal heart sounds and intact distal pulses.   Pulmonary/Chest: WOB normal and breath sounds without rales or wheezing  Abdominal: Soft. Bowel sounds are normal. NT. No HSM  Musculoskeletal: Normal range of motion. Exhibits no edema Lymphadenopathy: Has no other cervical adenopathy.  Neurological: Pt is alert and oriented to person, place, and time. Pt has normal reflexes. No cranial nerve deficit. Motor grossly intact, Gait intact Skin: Skin is warm and dry. No rash noted or new ulcerations Psychiatric:  Has normal mood and affect. Behavior is normal without agitation No other exam findings Lab Results  Component Value Date   WBC 5.7 02/07/2018   HGB 16.2 02/07/2018   HCT 47.9 02/07/2018   PLT 265.0 02/07/2018   GLUCOSE 120 (H) 02/07/2018   CHOL 114 02/07/2018   TRIG 86.0 02/07/2018   HDL 52.50 02/07/2018   LDLDIRECT 66 10/14/2016   LDLCALC 45 02/07/2018   ALT 43 02/07/2018   AST 25 02/07/2018   NA 140 02/07/2018   K 4.3 02/07/2018   CL 103 02/07/2018   CREATININE 1.07 02/07/2018   BUN 14 02/07/2018   CO2 29 02/07/2018   TSH 1.90 02/07/2018   PSA 1.30 02/07/2018   INR 0.97 10/07/2009   HGBA1C 6.1 02/07/2018         Assessment & Plan:

## 2018-02-26 ENCOUNTER — Other Ambulatory Visit: Payer: Self-pay

## 2018-02-26 DIAGNOSIS — D485 Neoplasm of uncertain behavior of skin: Secondary | ICD-10-CM | POA: Diagnosis not present

## 2018-02-26 DIAGNOSIS — L570 Actinic keratosis: Secondary | ICD-10-CM | POA: Diagnosis not present

## 2018-02-26 DIAGNOSIS — L821 Other seborrheic keratosis: Secondary | ICD-10-CM | POA: Diagnosis not present

## 2018-02-26 DIAGNOSIS — D2262 Melanocytic nevi of left upper limb, including shoulder: Secondary | ICD-10-CM | POA: Diagnosis not present

## 2018-03-01 DIAGNOSIS — E7800 Pure hypercholesterolemia, unspecified: Secondary | ICD-10-CM | POA: Diagnosis not present

## 2018-03-01 DIAGNOSIS — E669 Obesity, unspecified: Secondary | ICD-10-CM | POA: Diagnosis not present

## 2018-03-01 DIAGNOSIS — Z713 Dietary counseling and surveillance: Secondary | ICD-10-CM | POA: Diagnosis not present

## 2018-04-02 DIAGNOSIS — M9905 Segmental and somatic dysfunction of pelvic region: Secondary | ICD-10-CM | POA: Diagnosis not present

## 2018-04-02 DIAGNOSIS — M9903 Segmental and somatic dysfunction of lumbar region: Secondary | ICD-10-CM | POA: Diagnosis not present

## 2018-04-02 DIAGNOSIS — M9901 Segmental and somatic dysfunction of cervical region: Secondary | ICD-10-CM | POA: Diagnosis not present

## 2018-04-02 DIAGNOSIS — M5126 Other intervertebral disc displacement, lumbar region: Secondary | ICD-10-CM | POA: Diagnosis not present

## 2018-04-05 DIAGNOSIS — E669 Obesity, unspecified: Secondary | ICD-10-CM | POA: Diagnosis not present

## 2018-04-05 DIAGNOSIS — E7800 Pure hypercholesterolemia, unspecified: Secondary | ICD-10-CM | POA: Diagnosis not present

## 2018-04-05 DIAGNOSIS — M9901 Segmental and somatic dysfunction of cervical region: Secondary | ICD-10-CM | POA: Diagnosis not present

## 2018-04-05 DIAGNOSIS — Z713 Dietary counseling and surveillance: Secondary | ICD-10-CM | POA: Diagnosis not present

## 2018-04-05 DIAGNOSIS — M9903 Segmental and somatic dysfunction of lumbar region: Secondary | ICD-10-CM | POA: Diagnosis not present

## 2018-04-05 DIAGNOSIS — M9905 Segmental and somatic dysfunction of pelvic region: Secondary | ICD-10-CM | POA: Diagnosis not present

## 2018-04-05 DIAGNOSIS — M5126 Other intervertebral disc displacement, lumbar region: Secondary | ICD-10-CM | POA: Diagnosis not present

## 2018-04-11 DIAGNOSIS — M9903 Segmental and somatic dysfunction of lumbar region: Secondary | ICD-10-CM | POA: Diagnosis not present

## 2018-04-11 DIAGNOSIS — M9905 Segmental and somatic dysfunction of pelvic region: Secondary | ICD-10-CM | POA: Diagnosis not present

## 2018-04-11 DIAGNOSIS — M5126 Other intervertebral disc displacement, lumbar region: Secondary | ICD-10-CM | POA: Diagnosis not present

## 2018-04-11 DIAGNOSIS — M9901 Segmental and somatic dysfunction of cervical region: Secondary | ICD-10-CM | POA: Diagnosis not present

## 2018-04-18 DIAGNOSIS — M9903 Segmental and somatic dysfunction of lumbar region: Secondary | ICD-10-CM | POA: Diagnosis not present

## 2018-04-18 DIAGNOSIS — M9901 Segmental and somatic dysfunction of cervical region: Secondary | ICD-10-CM | POA: Diagnosis not present

## 2018-04-18 DIAGNOSIS — M5126 Other intervertebral disc displacement, lumbar region: Secondary | ICD-10-CM | POA: Diagnosis not present

## 2018-04-18 DIAGNOSIS — M9905 Segmental and somatic dysfunction of pelvic region: Secondary | ICD-10-CM | POA: Diagnosis not present

## 2018-04-19 ENCOUNTER — Other Ambulatory Visit: Payer: Self-pay | Admitting: Dermatology

## 2018-04-19 DIAGNOSIS — D234 Other benign neoplasm of skin of scalp and neck: Secondary | ICD-10-CM | POA: Diagnosis not present

## 2018-04-25 DIAGNOSIS — M9905 Segmental and somatic dysfunction of pelvic region: Secondary | ICD-10-CM | POA: Diagnosis not present

## 2018-04-25 DIAGNOSIS — M9901 Segmental and somatic dysfunction of cervical region: Secondary | ICD-10-CM | POA: Diagnosis not present

## 2018-04-25 DIAGNOSIS — M9903 Segmental and somatic dysfunction of lumbar region: Secondary | ICD-10-CM | POA: Diagnosis not present

## 2018-04-25 DIAGNOSIS — M5126 Other intervertebral disc displacement, lumbar region: Secondary | ICD-10-CM | POA: Diagnosis not present

## 2018-05-03 DIAGNOSIS — Z713 Dietary counseling and surveillance: Secondary | ICD-10-CM | POA: Diagnosis not present

## 2018-05-03 DIAGNOSIS — E669 Obesity, unspecified: Secondary | ICD-10-CM | POA: Diagnosis not present

## 2018-05-03 DIAGNOSIS — E7800 Pure hypercholesterolemia, unspecified: Secondary | ICD-10-CM | POA: Diagnosis not present

## 2018-05-31 DIAGNOSIS — Z713 Dietary counseling and surveillance: Secondary | ICD-10-CM | POA: Diagnosis not present

## 2018-05-31 DIAGNOSIS — E669 Obesity, unspecified: Secondary | ICD-10-CM | POA: Diagnosis not present

## 2018-05-31 DIAGNOSIS — E7800 Pure hypercholesterolemia, unspecified: Secondary | ICD-10-CM | POA: Diagnosis not present

## 2018-06-11 DIAGNOSIS — H52203 Unspecified astigmatism, bilateral: Secondary | ICD-10-CM | POA: Diagnosis not present

## 2018-06-11 DIAGNOSIS — H353121 Nonexudative age-related macular degeneration, left eye, early dry stage: Secondary | ICD-10-CM | POA: Diagnosis not present

## 2018-08-27 DIAGNOSIS — M9903 Segmental and somatic dysfunction of lumbar region: Secondary | ICD-10-CM | POA: Diagnosis not present

## 2018-08-27 DIAGNOSIS — M5126 Other intervertebral disc displacement, lumbar region: Secondary | ICD-10-CM | POA: Diagnosis not present

## 2018-08-27 DIAGNOSIS — M9905 Segmental and somatic dysfunction of pelvic region: Secondary | ICD-10-CM | POA: Diagnosis not present

## 2018-08-27 DIAGNOSIS — M9901 Segmental and somatic dysfunction of cervical region: Secondary | ICD-10-CM | POA: Diagnosis not present

## 2018-09-12 DIAGNOSIS — M9905 Segmental and somatic dysfunction of pelvic region: Secondary | ICD-10-CM | POA: Diagnosis not present

## 2018-09-12 DIAGNOSIS — M9903 Segmental and somatic dysfunction of lumbar region: Secondary | ICD-10-CM | POA: Diagnosis not present

## 2018-09-12 DIAGNOSIS — M9901 Segmental and somatic dysfunction of cervical region: Secondary | ICD-10-CM | POA: Diagnosis not present

## 2018-09-12 DIAGNOSIS — M5126 Other intervertebral disc displacement, lumbar region: Secondary | ICD-10-CM | POA: Diagnosis not present

## 2018-09-19 DIAGNOSIS — M9903 Segmental and somatic dysfunction of lumbar region: Secondary | ICD-10-CM | POA: Diagnosis not present

## 2018-09-19 DIAGNOSIS — M5126 Other intervertebral disc displacement, lumbar region: Secondary | ICD-10-CM | POA: Diagnosis not present

## 2018-09-19 DIAGNOSIS — M9905 Segmental and somatic dysfunction of pelvic region: Secondary | ICD-10-CM | POA: Diagnosis not present

## 2018-09-19 DIAGNOSIS — M9901 Segmental and somatic dysfunction of cervical region: Secondary | ICD-10-CM | POA: Diagnosis not present

## 2018-10-01 ENCOUNTER — Encounter: Payer: Self-pay | Admitting: *Deleted

## 2018-10-01 DIAGNOSIS — M5126 Other intervertebral disc displacement, lumbar region: Secondary | ICD-10-CM | POA: Diagnosis not present

## 2018-10-01 DIAGNOSIS — M9903 Segmental and somatic dysfunction of lumbar region: Secondary | ICD-10-CM | POA: Diagnosis not present

## 2018-10-01 DIAGNOSIS — M9901 Segmental and somatic dysfunction of cervical region: Secondary | ICD-10-CM | POA: Diagnosis not present

## 2018-10-01 DIAGNOSIS — M9905 Segmental and somatic dysfunction of pelvic region: Secondary | ICD-10-CM | POA: Diagnosis not present

## 2018-10-04 DIAGNOSIS — Z713 Dietary counseling and surveillance: Secondary | ICD-10-CM | POA: Diagnosis not present

## 2018-10-04 DIAGNOSIS — E669 Obesity, unspecified: Secondary | ICD-10-CM | POA: Diagnosis not present

## 2018-10-04 DIAGNOSIS — E7800 Pure hypercholesterolemia, unspecified: Secondary | ICD-10-CM | POA: Diagnosis not present

## 2018-10-12 DIAGNOSIS — M9901 Segmental and somatic dysfunction of cervical region: Secondary | ICD-10-CM | POA: Diagnosis not present

## 2018-10-12 DIAGNOSIS — M5126 Other intervertebral disc displacement, lumbar region: Secondary | ICD-10-CM | POA: Diagnosis not present

## 2018-10-12 DIAGNOSIS — M9903 Segmental and somatic dysfunction of lumbar region: Secondary | ICD-10-CM | POA: Diagnosis not present

## 2018-10-12 DIAGNOSIS — M9905 Segmental and somatic dysfunction of pelvic region: Secondary | ICD-10-CM | POA: Diagnosis not present

## 2018-11-05 DIAGNOSIS — M9903 Segmental and somatic dysfunction of lumbar region: Secondary | ICD-10-CM | POA: Diagnosis not present

## 2018-11-05 DIAGNOSIS — M9901 Segmental and somatic dysfunction of cervical region: Secondary | ICD-10-CM | POA: Diagnosis not present

## 2018-11-05 DIAGNOSIS — M9905 Segmental and somatic dysfunction of pelvic region: Secondary | ICD-10-CM | POA: Diagnosis not present

## 2018-11-05 DIAGNOSIS — M5126 Other intervertebral disc displacement, lumbar region: Secondary | ICD-10-CM | POA: Diagnosis not present

## 2018-11-09 DIAGNOSIS — M5126 Other intervertebral disc displacement, lumbar region: Secondary | ICD-10-CM | POA: Diagnosis not present

## 2018-11-09 DIAGNOSIS — M9903 Segmental and somatic dysfunction of lumbar region: Secondary | ICD-10-CM | POA: Diagnosis not present

## 2018-11-09 DIAGNOSIS — M9901 Segmental and somatic dysfunction of cervical region: Secondary | ICD-10-CM | POA: Diagnosis not present

## 2018-11-09 DIAGNOSIS — M9905 Segmental and somatic dysfunction of pelvic region: Secondary | ICD-10-CM | POA: Diagnosis not present

## 2018-11-16 DIAGNOSIS — M5126 Other intervertebral disc displacement, lumbar region: Secondary | ICD-10-CM | POA: Diagnosis not present

## 2018-11-16 DIAGNOSIS — M9903 Segmental and somatic dysfunction of lumbar region: Secondary | ICD-10-CM | POA: Diagnosis not present

## 2018-11-16 DIAGNOSIS — M9901 Segmental and somatic dysfunction of cervical region: Secondary | ICD-10-CM | POA: Diagnosis not present

## 2018-11-16 DIAGNOSIS — M9905 Segmental and somatic dysfunction of pelvic region: Secondary | ICD-10-CM | POA: Diagnosis not present

## 2018-11-26 DIAGNOSIS — M9903 Segmental and somatic dysfunction of lumbar region: Secondary | ICD-10-CM | POA: Diagnosis not present

## 2018-11-26 DIAGNOSIS — M5126 Other intervertebral disc displacement, lumbar region: Secondary | ICD-10-CM | POA: Diagnosis not present

## 2018-11-26 DIAGNOSIS — M9905 Segmental and somatic dysfunction of pelvic region: Secondary | ICD-10-CM | POA: Diagnosis not present

## 2018-11-26 DIAGNOSIS — M9901 Segmental and somatic dysfunction of cervical region: Secondary | ICD-10-CM | POA: Diagnosis not present

## 2018-11-28 DIAGNOSIS — E7800 Pure hypercholesterolemia, unspecified: Secondary | ICD-10-CM | POA: Diagnosis not present

## 2018-11-28 DIAGNOSIS — Z713 Dietary counseling and surveillance: Secondary | ICD-10-CM | POA: Diagnosis not present

## 2018-11-28 DIAGNOSIS — E669 Obesity, unspecified: Secondary | ICD-10-CM | POA: Diagnosis not present

## 2018-12-16 DIAGNOSIS — Z20828 Contact with and (suspected) exposure to other viral communicable diseases: Secondary | ICD-10-CM | POA: Diagnosis not present

## 2018-12-17 ENCOUNTER — Telehealth: Payer: Self-pay | Admitting: Cardiology

## 2018-12-17 NOTE — Telephone Encounter (Signed)
New Message   Patient is calling because he is scheduled to see Dr. Percival Spanish on 9/28 he states per his notes that he is to have labs done but I am not showing a order for labs. Please call to discuss.

## 2018-12-17 NOTE — Telephone Encounter (Signed)
The patient has been made aware that his PCP has ordered fasting labs. He will have these done at his appointment with Dr. Percival Spanish.

## 2018-12-21 DIAGNOSIS — M9903 Segmental and somatic dysfunction of lumbar region: Secondary | ICD-10-CM | POA: Diagnosis not present

## 2018-12-21 DIAGNOSIS — M9901 Segmental and somatic dysfunction of cervical region: Secondary | ICD-10-CM | POA: Diagnosis not present

## 2018-12-21 DIAGNOSIS — M9905 Segmental and somatic dysfunction of pelvic region: Secondary | ICD-10-CM | POA: Diagnosis not present

## 2018-12-21 DIAGNOSIS — M5126 Other intervertebral disc displacement, lumbar region: Secondary | ICD-10-CM | POA: Diagnosis not present

## 2018-12-22 NOTE — Progress Notes (Signed)
Cardiology Office Note   Date:  12/24/2018   ID:  Lonnel, Jared Mar 01, 1961, MRN [id number]  PCP:  Biagio Borg, MD  Cardiologist:   Minus Breeding, MD   Chief Complaint  Patient presents with  . Elevated Coronary Calcium      History of Present Illness: Brett Griffith is a 58 y.o. male who presents for evaluation of elevated coronary calcium.   I saw him in  2016 for evaluation of dyspnea.  He had extensive coronary calcium.  However, he had a negative stress echo in Jan of 2017 and a negative POET (Plain Old Exercise Treadmill) in 2018.    He returns for yearly follow-up.  It sounds like he has been doing well since I last saw him.  He has not had any new chest pressure, neck or arm discomfort.  He is not been having any new palpitations, presyncope or syncope.  He has no weight gain or edema.  He has been trying to change his diet.  He is not been exercising as much because of the pandemic.   Past Medical History:  Diagnosis Date  . Allergy   . 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 Atypical nevus 02/26/2018   left post sholder superior--moderate  . History of kidney stones   . HYPERLIPIDEMIA 01/08/2009  . Impaired glucose tolerance 09/21/2010  . NEPHROLITHIASIS, HX OF 01/08/2009  . Shingles   . TIA (transient ischemic attack)     Past Surgical History:  Procedure Laterality Date  . INSERTION OF MESH N/A 08/10/2016   Procedure: INSERTION OF MESH;  Surgeon: Clovis Riley, MD;  Location: [facility];  Service: General;  Laterality: N/A;  . s/p ganglion cyst     right wrist  . s/p pilonidal cystectomy    . UMBILICAL HERNIA REPAIR N/A 08/10/2016   Procedure: UMBILICAL HERNIA REPAIR;  Surgeon: Clovis Riley, MD;  Location: [place];  Service: General;  Laterality: N/A;  . WISDOM TOOTH EXTRACTION       Current Outpatient Medications  Medication Sig Dispense Refill  . aspirin EC 81 MG tablet Take 1  tablet (81 mg total) by mouth daily. 90 tablet 11  . Coenzyme Q10 (CO Q-10 PO) Take 1 tablet by mouth daily.    Marland Kitchen ezetimibe (ZETIA) 10 MG tablet Take 1 tablet (10 mg total) by mouth daily. 90 tablet 3  . HELIOCARE 240 MG CAPS Take 240 mg by mouth daily.    . LUTEIN-ZEAXANTHIN PO Take by mouth.    . Multiple Vitamin (MULTIVITAMIN) tablet Take 1 tablet by mouth daily.    Marland Kitchen OVER THE COUNTER MEDICATION Tumeric 100 mg. One tablet daily.    . rosuvastatin (CRESTOR) 40 MG tablet Take 1 tablet (40 mg total) by mouth daily. 90 tablet 3  . amLODipine (NORVASC) 2.5 MG tablet Take 1 tablet (2.5 mg total) by mouth daily. 180 tablet 3   No current facility-administered medications for this visit.     Allergies:   Ezetimibe-simvastatin    ROS:  Please see the history of present illness.   Otherwise, review of systems are positive for none.   All other systems are reviewed and negative.    PHYSICAL EXAM: VS:  BP (!) 146/82   Pulse 69   Ht 5\' 7"  (1.702 m)   Wt 218 lb (98.9 kg)   BMI 34.14 kg/m  , BMI Body mass index is 34.14 kg/m.  GENERAL:  Well appearing NECK:  No jugular venous distention, waveform within normal limits, carotid upstroke brisk and symmetric, no bruits, no thyromegaly LUNGS:  Clear to auscultation bilaterally CHEST:  Unremarkable HEART:  PMI not displaced or sustained,S1 and S2 within normal limits, no S3, no S4, no clicks, no rubs, no murmurs ABD:  Flat, positive bowel sounds normal in frequency in pitch, no bruits, no rebound, no guarding, no midline pulsatile mass, no hepatomegaly, no splenomegaly EXT:  2 plus pulses throughout, no edema, no cyanosis no clubbing   EKG:  EKG is  ordered today. The ekg ordered today demonstrates sinus rhythm, rate 69, axis within normal limits, intervals within normal limits, no acute ST-T wave changes.   Recent Labs: 02/07/2018: ALT 43; BUN 14; Creatinine, Ser 1.07; Hemoglobin 16.2; Platelets 265.0; Potassium 4.3; Sodium 140; TSH 1.90     Lipid Panel    Component Value Date/Time   CHOL 114 02/07/2018 0730   CHOL 166 11/07/2017 0824   TRIG 86.0 02/07/2018 0730   HDL 52.50 02/07/2018 0730   HDL 54 11/07/2017 0824   CHOLHDL 2 02/07/2018 0730   VLDL 17.2 02/07/2018 0730   LDLCALC 45 02/07/2018 0730   LDLCALC 91 11/07/2017 0824   LDLDIRECT 66 10/14/2016 0806   LDLDIRECT 123 03/31/2015 0838      Wt Readings from Last 3 Encounters:  12/24/18 218 lb (98.9 kg)  02/09/18 217 lb (98.4 kg)  11/13/17 222 lb (100.7 kg)      Other studies Reviewed: Additional studies/ records that were reviewed today include: Labs Review of the above records demonstrates:     ASSESSMENT AND PLAN:   CORONARY CALICIUM:   He had no evidence for ischemia on the POET (Plain Old Exercise Treadmill) in 2018.  I would bring him back for repeat treadmill given his 95th percentile calcium score.  DYSLIPIDEMIA:    His LDL was 30 earlier this year but he said elevated direct LDLs in the past and again have this added to his labs.  Target.  Direct LDL less than 70.   HTN:   My goal for him is 120s/70s.  He is not quite clear.  Again had Norvasc 2.5 mg daily.   OVERWEIGHT: We talked about weight loss with diet and exercise.   Current medicines are reviewed at length with the patient today.  The patient does not have concerns regarding medicines.  The following changes have been made:  As above  Labs/ tests ordered today include:    Orders Placed This Encounter  Procedures  . Direct LDL  . EXERCISE TOLERANCE TEST (ETT)  . EKG 12-Lead     Disposition:   FU with me in one year.    Signed, Minus Breeding, MD  12/24/2018 10:57 AM    [facility]

## 2018-12-24 ENCOUNTER — Ambulatory Visit: Payer: BC Managed Care – PPO | Admitting: Cardiology

## 2018-12-24 ENCOUNTER — Encounter: Payer: Self-pay | Admitting: Cardiology

## 2018-12-24 ENCOUNTER — Other Ambulatory Visit: Payer: Self-pay

## 2018-12-24 VITALS — BP 146/82 | HR 69 | Ht 67.0 in | Wt 218.0 lb

## 2018-12-24 DIAGNOSIS — R931 Abnormal findings on diagnostic imaging of heart and coronary circulation: Secondary | ICD-10-CM | POA: Diagnosis not present

## 2018-12-24 DIAGNOSIS — E785 Hyperlipidemia, unspecified: Secondary | ICD-10-CM | POA: Diagnosis not present

## 2018-12-24 DIAGNOSIS — E663 Overweight: Secondary | ICD-10-CM

## 2018-12-24 DIAGNOSIS — I10 Essential (primary) hypertension: Secondary | ICD-10-CM | POA: Diagnosis not present

## 2018-12-24 MED ORDER — AMLODIPINE BESYLATE 2.5 MG PO TABS: 2.5000 mg | ORAL_TABLET | Freq: Every day | ORAL | 3 refills | Status: DC

## 2018-12-24 NOTE — Patient Instructions (Addendum)
Medication Instructions:  Start taking Norsvac 2.5mg  daily.   If you need a refill on your cardiac medications before your next appointment, please call your pharmacy.   Lab work: Biomedical scientist LDL @ [facility] If you have labs (blood work) drawn today and your tests are completely normal, you will receive your results only by: [facility] (if you have MyChart) OR A paper copy in the mail If you have any lab test that is abnormal or we need to change your treatment, we will call you to review the results.  Testing/Procedures: Your physician has requested that you have an exercise tolerance test. For further information please visit [email or web address]. Please also follow instruction sheet, as given.    Follow-Up: At [facility], you and your health needs are our priority.  As part of our continuing mission to provide you with exceptional heart care, we have created designated Provider Care Teams.  These Care Teams include your primary Cardiologist (physician) and Advanced Practice Providers (APPs -  Physician Assistants and Nurse Practitioners) who all work together to provide you with the care you need, when you need it. You will need a follow up appointment in 12 months.  Please call our office 2 months in advance to schedule this appointment.  You may see Dr. Percival Spanish or one of the following Advanced Practice Providers on your designated Care Team:   Rosaria Ferries, PA-C Jory Sims, DNP, ANP  Any Other Special Instructions Will Be Listed Below (If Applicable). You will have to have a Covid Test prior to exercise test.

## 2019-01-10 ENCOUNTER — Telehealth (HOSPITAL_COMMUNITY): Payer: Self-pay

## 2019-01-10 NOTE — Telephone Encounter (Signed)
Encounter complete. 

## 2019-01-11 ENCOUNTER — Other Ambulatory Visit (HOSPITAL_COMMUNITY)
Admission: RE | Admit: 2019-01-11 | Discharge: 2019-01-11 | Disposition: A | Discharge status: Home / Self Care | Payer: BC Managed Care – PPO | Source: Ambulatory Visit | Attending: Cardiology | Admitting: Cardiology

## 2019-01-11 DIAGNOSIS — M9905 Segmental and somatic dysfunction of pelvic region: Secondary | ICD-10-CM | POA: Diagnosis not present

## 2019-01-11 DIAGNOSIS — Z20828 Contact with and (suspected) exposure to other viral communicable diseases: Secondary | ICD-10-CM | POA: Insufficient documentation

## 2019-01-11 DIAGNOSIS — Z01812 Encounter for preprocedural laboratory examination: Secondary | ICD-10-CM | POA: Insufficient documentation

## 2019-01-11 DIAGNOSIS — M5126 Other intervertebral disc displacement, lumbar region: Secondary | ICD-10-CM | POA: Diagnosis not present

## 2019-01-11 DIAGNOSIS — M9903 Segmental and somatic dysfunction of lumbar region: Secondary | ICD-10-CM | POA: Diagnosis not present

## 2019-01-11 DIAGNOSIS — M9901 Segmental and somatic dysfunction of cervical region: Secondary | ICD-10-CM | POA: Diagnosis not present

## 2019-01-12 LAB — NOVEL CORONAVIRUS, NAA (HOSP ORDER, SEND-OUT TO REF LAB; TAT 18-24 HRS): SARS-CoV-2, NAA: NOT DETECTED

## 2019-01-15 ENCOUNTER — Other Ambulatory Visit: Payer: Self-pay

## 2019-01-15 ENCOUNTER — Ambulatory Visit (HOSPITAL_COMMUNITY)
Admission: RE | Admit: 2019-01-15 | Discharge: 2019-01-15 | Disposition: A | Discharge status: Home / Self Care | Payer: BC Managed Care – PPO | Source: Ambulatory Visit | Attending: Cardiology | Admitting: Cardiology

## 2019-01-15 DIAGNOSIS — R931 Abnormal findings on diagnostic imaging of heart and coronary circulation: Secondary | ICD-10-CM

## 2019-01-15 DIAGNOSIS — I10 Essential (primary) hypertension: Secondary | ICD-10-CM | POA: Diagnosis not present

## 2019-01-15 LAB — EXERCISE TOLERANCE TEST
Estimated workload: 13.5 METS
Exercise duration (min): 11 min
Exercise duration (sec): 0 s
MPHR: 164 {beats}/min
Peak HR: 169 {beats}/min
Percent HR: 103 %
Rest HR: 68 {beats}/min

## 2019-01-30 DIAGNOSIS — M9901 Segmental and somatic dysfunction of cervical region: Secondary | ICD-10-CM | POA: Diagnosis not present

## 2019-01-30 DIAGNOSIS — M5126 Other intervertebral disc displacement, lumbar region: Secondary | ICD-10-CM | POA: Diagnosis not present

## 2019-01-30 DIAGNOSIS — M9903 Segmental and somatic dysfunction of lumbar region: Secondary | ICD-10-CM | POA: Diagnosis not present

## 2019-01-30 DIAGNOSIS — M9905 Segmental and somatic dysfunction of pelvic region: Secondary | ICD-10-CM | POA: Diagnosis not present

## 2019-01-31 DIAGNOSIS — Z713 Dietary counseling and surveillance: Secondary | ICD-10-CM | POA: Diagnosis not present

## 2019-01-31 DIAGNOSIS — E669 Obesity, unspecified: Secondary | ICD-10-CM | POA: Diagnosis not present

## 2019-01-31 DIAGNOSIS — E7800 Pure hypercholesterolemia, unspecified: Secondary | ICD-10-CM | POA: Diagnosis not present

## 2019-02-08 ENCOUNTER — Other Ambulatory Visit (INDEPENDENT_AMBULATORY_CARE_PROVIDER_SITE_OTHER): Payer: BC Managed Care – PPO

## 2019-02-08 DIAGNOSIS — R7302 Impaired glucose tolerance (oral): Secondary | ICD-10-CM | POA: Diagnosis not present

## 2019-02-08 DIAGNOSIS — Z0000 Encounter for general adult medical examination without abnormal findings: Secondary | ICD-10-CM

## 2019-02-08 LAB — CBC WITH DIFFERENTIAL/PLATELET
Basophils Absolute: 0 10*3/uL (ref 0.0–0.1)
Basophils Relative: 0.6 % (ref 0.0–3.0)
Eosinophils Absolute: 0.2 10*3/uL (ref 0.0–0.7)
Eosinophils Relative: 2.6 % (ref 0.0–5.0)
HCT: 46.3 % (ref 39.0–52.0)
Hemoglobin: 15.7 g/dL (ref 13.0–17.0)
Lymphocytes Relative: 28.1 % (ref 12.0–46.0)
Lymphs Abs: 2 10*3/uL (ref 0.7–4.0)
MCHC: 33.8 g/dL (ref 30.0–36.0)
MCV: 91.1 fl (ref 78.0–100.0)
Monocytes Absolute: 0.6 10*3/uL (ref 0.1–1.0)
Monocytes Relative: 8.9 % (ref 3.0–12.0)
Neutro Abs: 4.2 10*3/uL (ref 1.4–7.7)
Neutrophils Relative %: 59.8 % (ref 43.0–77.0)
Platelets: 272 10*3/uL (ref 150.0–400.0)
RBC: 5.09 Mil/uL (ref 4.22–5.81)
RDW: 12.9 % (ref 11.5–15.5)
WBC: 7 10*3/uL (ref 4.0–10.5)

## 2019-02-08 LAB — HEMOGLOBIN A1C: Hgb A1c MFr Bld: 6.1 % (ref 4.6–6.5)

## 2019-02-08 LAB — BASIC METABOLIC PANEL
BUN: 17 mg/dL (ref 6–23)
CO2: 27 mEq/L (ref 19–32)
Calcium: 9.7 mg/dL (ref 8.4–10.5)
Chloride: 102 mEq/L (ref 96–112)
Creatinine, Ser: 1.09 mg/dL (ref 0.40–1.50)
GFR: 69.84 mL/min (ref >60.00)
Glucose, Bld: 110 mg/dL — ABNORMAL HIGH (ref 70–99)
Potassium: 4.3 mEq/L (ref 3.5–5.1)
Sodium: 139 mEq/L (ref 135–145)

## 2019-02-08 LAB — HEPATIC FUNCTION PANEL
ALT: 38 U/L (ref 0–53)
AST: 26 U/L (ref 0–37)
Albumin: 4.9 g/dL (ref 3.5–5.2)
Alkaline Phosphatase: 56 U/L (ref 39–117)
Bilirubin, Direct: 0.1 mg/dL (ref 0.0–0.3)
Total Bilirubin: 0.6 mg/dL (ref 0.2–1.2)
Total Protein: 7.1 g/dL (ref 6.0–8.3)

## 2019-02-08 LAB — LIPID PANEL
Cholesterol: 129 mg/dL (ref 0–200)
HDL: 58.2 mg/dL (ref >39.00)
LDL Cholesterol: 56 mg/dL (ref 0–99)
NonHDL: 71.19
Total CHOL/HDL Ratio: 2
Triglycerides: 75 mg/dL (ref 0.0–149.0)
VLDL: 15 mg/dL (ref 0.0–40.0)

## 2019-02-08 LAB — URINALYSIS, ROUTINE W REFLEX MICROSCOPIC
Bilirubin Urine: NEGATIVE
Hgb urine dipstick: NEGATIVE
Ketones, ur: NEGATIVE
Leukocytes,Ua: NEGATIVE
Nitrite: NEGATIVE
Specific Gravity, Urine: 1.015 (ref 1.000–1.030)
Total Protein, Urine: NEGATIVE
Urine Glucose: NEGATIVE
Urobilinogen, UA: 0.2 (ref 0.0–1.0)
pH: 7 (ref 5.0–8.0)

## 2019-02-08 LAB — PSA: PSA: 0.61 ng/mL (ref 0.10–4.00)

## 2019-02-08 LAB — TSH: TSH: 2.37 u[IU]/mL (ref 0.35–4.50)

## 2019-02-13 ENCOUNTER — Other Ambulatory Visit: Payer: Self-pay

## 2019-02-13 ENCOUNTER — Encounter: Payer: Self-pay | Admitting: Internal Medicine

## 2019-02-13 ENCOUNTER — Ambulatory Visit (INDEPENDENT_AMBULATORY_CARE_PROVIDER_SITE_OTHER): Payer: BC Managed Care – PPO | Admitting: Internal Medicine

## 2019-02-13 VITALS — BP 126/82 | HR 76 | Temp 98.5°F | Ht 67.0 in | Wt 218.0 lb

## 2019-02-13 DIAGNOSIS — R739 Hyperglycemia, unspecified: Secondary | ICD-10-CM

## 2019-02-13 DIAGNOSIS — M545 Low back pain, unspecified: Secondary | ICD-10-CM | POA: Insufficient documentation

## 2019-02-13 DIAGNOSIS — R7302 Impaired glucose tolerance (oral): Secondary | ICD-10-CM

## 2019-02-13 DIAGNOSIS — Z0000 Encounter for general adult medical examination without abnormal findings: Secondary | ICD-10-CM | POA: Diagnosis not present

## 2019-02-13 DIAGNOSIS — E611 Iron deficiency: Secondary | ICD-10-CM

## 2019-02-13 DIAGNOSIS — E538 Deficiency of other specified B group vitamins: Secondary | ICD-10-CM

## 2019-02-13 DIAGNOSIS — E559 Vitamin D deficiency, unspecified: Secondary | ICD-10-CM

## 2019-02-13 MED ORDER — ROSUVASTATIN CALCIUM 40 MG PO TABS: 40.0000 mg | ORAL_TABLET | Freq: Every day | ORAL | 3 refills | Status: DC

## 2019-02-13 MED ORDER — ZOSTER VAC RECOMB ADJUVANTED 50 MCG/0.5ML IM SUSR: 0.5000 mL | Freq: Once | INTRAMUSCULAR | 1 refills | Status: AC

## 2019-02-13 MED ORDER — EZETIMIBE 10 MG PO TABS: 10.0000 mg | ORAL_TABLET | Freq: Every day | ORAL | 3 refills | Status: DC

## 2019-02-13 MED ORDER — CYCLOBENZAPRINE HCL 5 MG PO TABS: 5.0000 mg | ORAL_TABLET | Freq: Three times a day (TID) | ORAL | 3 refills | Status: DC | PRN

## 2019-02-13 NOTE — Patient Instructions (Addendum)
Your shingles shot prescription was sent to the pharmacy  Please take all new medication as prescribed - the muscle relaxer as needed  Please continue all other medications as before, and refills have been done if requested.  Please have the pharmacy call with any other refills you may need.  Please continue your efforts at being more active, low cholesterol diet, and weight control.  You are otherwise up to date with prevention measures today.  Please keep your appointments with your specialists as you may have planned  Please return in 1 year for your yearly visit, or sooner if needed, with Lab testing done 3-5 days before

## 2019-02-13 NOTE — Progress Notes (Signed)
Subjective:    Patient ID: Brett Griffith, male    DOB: July 09, 1960, 58 y.o.   MRN: [id number]  HPI    Here for wellness and f/u;  Overall doing ok;  Pt denies Chest pain, worsening SOB, DOE, wheezing, orthopnea, PND, worsening LE edema, palpitations, dizziness or syncope.  Pt denies neurological change such as new headache, facial or extremity weakness.  Pt denies polydipsia, polyuria, or low sugar symptoms. Pt states overall good compliance with treatment and medications, good tolerability, and has been trying to follow appropriate diet.  Pt denies worsening depressive symptoms, suicidal ideation or panic. No fever, night sweats, wt loss, loss of appetite, or other constitutional symptoms.  Pt states good ability with ADL's, has low fall risk, home safety reviewed and adequate, no other significant changes in hearing or vision, and only occasionally active with exercise.  Pt continues to have recurring LBP without change in severity, bowel or bladder change, fever, wt loss,  worsening LE pain/numbness/weakness, gait change or falls, but none recently and has TENS unit recently that worked.   Past Medical History:  Diagnosis Date  . Allergy   . 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 Atypical nevus 02/26/2018   left post sholder superior--moderate  . History of kidney stones   . HYPERLIPIDEMIA 01/08/2009  . Impaired glucose tolerance 09/21/2010  . NEPHROLITHIASIS, HX OF 01/08/2009  . Shingles   . TIA (transient ischemic attack)    Past Surgical History:  Procedure Laterality Date  . INSERTION OF MESH N/A 08/10/2016   Procedure: INSERTION OF MESH;  Surgeon: Clovis Riley, MD;  Location: [place] [facility];  Service: General;  Laterality: N/A;  . s/p ganglion cyst     right wrist  . s/p pilonidal cystectomy    . UMBILICAL HERNIA REPAIR N/A 08/10/2016   Procedure: UMBILICAL HERNIA REPAIR;  Surgeon: Clovis Riley, MD;  Location: [place];   Service: General;  Laterality: N/A;  . WISDOM TOOTH EXTRACTION      reports that he has never smoked. He has never used smokeless tobacco. He reports current alcohol use of about 2.0 standard drinks of alcohol per week. He reports that he does not use drugs. family history includes CAD (age of onset: 9) in his brother; CAD (age of onset: 59) in his father; Hypertension in his brother, father, and mother; Peripheral vascular disease (age of onset: 11) in his mother. Allergies  Allergen Reactions  . Ezetimibe-Simvastatin     REACTION: more emotional   Current Outpatient Medications on File Prior to Visit  Medication Sig Dispense Refill  . amLODipine (NORVASC) 2.5 MG tablet Take 1 tablet (2.5 mg total) by mouth daily. 180 tablet 3  . aspirin EC 81 MG tablet Take 1 tablet (81 mg total) by mouth daily. 90 tablet 11  . Coenzyme Q10 (CO Q-10 PO) Take 1 tablet by mouth daily.    Marland Kitchen HELIOCARE 240 MG CAPS Take 240 mg by mouth daily.    . LUTEIN-ZEAXANTHIN PO Take by mouth.    . Multiple Vitamin (MULTIVITAMIN) tablet Take 1 tablet by mouth daily.    Marland Kitchen OVER THE COUNTER MEDICATION Tumeric 100 mg. One tablet daily.     No current facility-administered medications on file prior to visit.    Review of Systems Constitutional: Negative for other unusual diaphoresis, sweats, appetite or weight changes HENT: Negative for other worsening hearing loss, ear pain, facial swelling, mouth sores or neck stiffness.  Eyes: Negative for other worsening pain, redness or other visual disturbance.  Respiratory: Negative for other stridor or swelling Cardiovascular: Negative for other palpitations or other chest pain  Gastrointestinal: Negative for worsening diarrhea or loose stools, blood in stool, distention or other pain Genitourinary: Negative for hematuria, flank pain or other change in urine volume.  Musculoskeletal: Negative for myalgias or other joint swelling.  Skin: Negative for other color change, or other  wound or worsening drainage.  Neurological: Negative for other syncope or numbness. Hematological: Negative for other adenopathy or swelling Psychiatric/Behavioral: Negative for hallucinations, other worsening agitation, SI, self-injury, or new decreased concentration All otherwise neg per pt     Objective:   Physical Exam BP 126/82   Pulse 76   Temp 98.5 F (36.9 C) (Oral)   Ht 5\' 7"  (1.702 m)   Wt 218 lb (98.9 kg)   SpO2 95%   BMI 34.14 kg/m  VS noted,  Constitutional: Pt is oriented to person, place, and time. Appears well-developed and well-nourished, in no significant distress and comfortable Head: Normocephalic and atraumatic  Eyes: Conjunctivae and EOM are normal. Pupils are equal, round, and reactive to light Right Ear: External ear normal without discharge Left Ear: External ear normal without discharge Nose: Nose without discharge or deformity Mouth/Throat: Oropharynx is without other ulcerations and moist  Neck: Normal range of motion. Neck supple. No JVD present. No tracheal deviation present or significant neck LA or mass Cardiovascular: Normal rate, regular rhythm, normal heart sounds and intact distal pulses.   Pulmonary/Chest: WOB normal and breath sounds without rales or wheezing  Abdominal: Soft. Bowel sounds are normal. NT. No HSM  Musculoskeletal: Normal range of motion. Exhibits no edema Lymphadenopathy: Has no other cervical adenopathy.  Neurological: Pt is alert and oriented to person, place, and time. Pt has normal reflexes. No cranial nerve deficit. Motor grossly intact, Gait intact Skin: Skin is warm and dry. No rash noted or new ulcerations Psychiatric:  Has normal mood and affect. Behavior is normal without agitation All otherwise neg per pt Lab Results  Component Value Date   WBC 7.0 02/08/2019   HGB 15.7 02/08/2019   HCT 46.3 02/08/2019   PLT 272.0 02/08/2019   GLUCOSE 110 (H) 02/08/2019   CHOL 129 02/08/2019   TRIG 75.0 02/08/2019   HDL  58.20 02/08/2019   LDLDIRECT 66 10/14/2016   LDLCALC 56 02/08/2019   ALT 38 02/08/2019   AST 26 02/08/2019   NA 139 02/08/2019   K 4.3 02/08/2019   CL 102 02/08/2019   CREATININE 1.09 02/08/2019   BUN 17 02/08/2019   CO2 27 02/08/2019   TSH 2.37 02/08/2019   PSA 0.61 02/08/2019   INR 0.97 10/07/2009   HGBA1C 6.1 02/08/2019      Assessment & Plan:

## 2019-02-16 ENCOUNTER — Encounter: Payer: Self-pay | Admitting: Internal Medicine

## 2019-02-16 NOTE — Assessment & Plan Note (Signed)

## 2019-02-16 NOTE — Assessment & Plan Note (Signed)
stable overall by history and exam, recent data reviewed with pt, and pt to continue medical treatment as before,  to f/u any worsening symptoms or concerns  

## 2019-02-16 NOTE — Assessment & Plan Note (Signed)
Chronic stable, for flexeril prn

## 2019-02-19 DIAGNOSIS — M9905 Segmental and somatic dysfunction of pelvic region: Secondary | ICD-10-CM | POA: Diagnosis not present

## 2019-02-19 DIAGNOSIS — M9901 Segmental and somatic dysfunction of cervical region: Secondary | ICD-10-CM | POA: Diagnosis not present

## 2019-02-19 DIAGNOSIS — M5126 Other intervertebral disc displacement, lumbar region: Secondary | ICD-10-CM | POA: Diagnosis not present

## 2019-02-19 DIAGNOSIS — Z20828 Contact with and (suspected) exposure to other viral communicable diseases: Secondary | ICD-10-CM | POA: Diagnosis not present

## 2019-02-19 DIAGNOSIS — M9903 Segmental and somatic dysfunction of lumbar region: Secondary | ICD-10-CM | POA: Diagnosis not present

## 2019-02-28 DIAGNOSIS — E7800 Pure hypercholesterolemia, unspecified: Secondary | ICD-10-CM | POA: Diagnosis not present

## 2019-02-28 DIAGNOSIS — Z713 Dietary counseling and surveillance: Secondary | ICD-10-CM | POA: Diagnosis not present

## 2019-02-28 DIAGNOSIS — E669 Obesity, unspecified: Secondary | ICD-10-CM | POA: Diagnosis not present

## 2019-12-25 DIAGNOSIS — Z1152 Encounter for screening for COVID-19: Secondary | ICD-10-CM

## 2019-12-25 DIAGNOSIS — R931 Abnormal findings on diagnostic imaging of heart and coronary circulation: Secondary | ICD-10-CM

## 2019-12-25 DIAGNOSIS — E785 Hyperlipidemia, unspecified: Secondary | ICD-10-CM

## 2019-12-27 NOTE — Telephone Encounter (Signed)
Minus Breeding, MD  You 14 hours ago (5:07 PM)   I would like a lipid profile and a direct LDL and liver enzymes.Thanks   Message text    Verbal OK from MD to order COVID-19 anitbody testing per patient request

## 2019-12-30 LAB — HEPATIC FUNCTION PANEL
ALT: 42 IU/L (ref 0–44)
AST: 25 IU/L (ref 0–40)
Albumin: 4.9 g/dL (ref 3.8–4.9)
Alkaline Phosphatase: 57 IU/L (ref 44–121)
Bilirubin Total: 0.4 mg/dL (ref 0.0–1.2)
Bilirubin, Direct: 0.13 mg/dL (ref 0.00–0.40)
Total Protein: 6.8 g/dL (ref 6.0–8.5)

## 2019-12-30 LAB — LIPID PANEL
Chol/HDL Ratio: 2.2 ratio (ref 0.0–5.0)
Cholesterol, Total: 123 mg/dL (ref 100–199)
HDL: 55 mg/dL (ref >39)
LDL Chol Calc (NIH): 55 mg/dL (ref 0–99)
Triglycerides: 61 mg/dL (ref 0–149)
VLDL Cholesterol Cal: 13 mg/dL (ref 5–40)

## 2019-12-30 LAB — LDL CHOLESTEROL, DIRECT: LDL Direct: 51 mg/dL (ref 0–99)

## 2019-12-30 NOTE — Progress Notes (Signed)
Cardiology Office Note   Date:  01/02/2020   ID:  Brett, Griffith [birth year], MRN [id number]  PCP:  Biagio Borg, MD  Cardiologist:   Minus Breeding, MD   Chief Complaint  Patient presents with  . Elevated Coronary Calcium      History of Present Illness: Brett Griffith is a 59 y.o. male who presents for evaluation of elevated coronary calcium.   I saw him in  2016 for evaluation of dyspnea.  He had extensive coronary calcium.  However, he had a negative stress echo in Jan of 2017 and a negative POET (Plain Old Exercise Treadmill) in 2018.    He returns for follow up.  He exercises in the gym at 430 every morning!  He does the treadmill with dumbbells.  He rows and gets his heart rate to the 130s 140s.  The patient denies any new symptoms such as chest discomfort, neck or arm discomfort. There has been no new shortness of breath, PND or orthopnea. There have been no reported palpitations, presyncope or syncope.   Past Medical History:  Diagnosis Date  . Allergy   . 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 Atypical nevus 02/26/2018   left post sholder superior--moderate  . History of kidney stones   . HYPERLIPIDEMIA 01/08/2009  . Impaired glucose tolerance 09/21/2010  . NEPHROLITHIASIS, HX OF 01/08/2009  . Shingles   . TIA (transient ischemic attack)     Past Surgical History:  Procedure Laterality Date  . INSERTION OF MESH N/A 08/10/2016   Procedure: INSERTION OF MESH;  Surgeon: Clovis Riley, MD;  Location: [place];  Service: General;  Laterality: N/A;  . s/p ganglion cyst     right wrist  . s/p pilonidal cystectomy    . UMBILICAL HERNIA REPAIR N/A 08/10/2016   Procedure: UMBILICAL HERNIA REPAIR;  Surgeon: Clovis Riley, MD;  Location: [place];  Service: General;  Laterality: N/A;  . WISDOM TOOTH EXTRACTION       Current Outpatient Medications  Medication Sig Dispense Refill  . amLODipine  (NORVASC) 2.5 MG tablet Take 1 tablet (2.5 mg total) by mouth daily. 180 tablet 3  . aspirin EC 81 MG tablet Take 1 tablet (81 mg total) by mouth daily. 90 tablet 11  . Coenzyme Q10 (CO Q-10 PO) Take 1 tablet by mouth daily.    . cyclobenzaprine (FLEXERIL) 5 MG tablet Take 1 tablet (5 mg total) by mouth 3 (three) times daily as needed for muscle spasms. 90 tablet 3  . ezetimibe (ZETIA) 10 MG tablet Take 1 tablet (10 mg total) by mouth daily. 90 tablet 3  . HELIOCARE 240 MG CAPS Take 240 mg by mouth daily.    . LUTEIN-ZEAXANTHIN PO Take by mouth.    . Multiple Vitamin (MULTIVITAMIN) tablet Take 1 tablet by mouth daily.    Marland Kitchen OVER THE COUNTER MEDICATION Tumeric 100 mg. One tablet daily.    . rosuvastatin (CRESTOR) 40 MG tablet Take 1 tablet (40 mg total) by mouth daily. 90 tablet 3   No current facility-administered medications for this visit.    Allergies:   Ezetimibe-simvastatin    ROS:  Please see the history of present illness.   Otherwise, review of systems are positive for none.   All other systems are reviewed and negative.    PHYSICAL EXAM: VS:  BP 124/65   Pulse 77   Ht 5\' 7"  (1.702 m)  Wt 212 lb (96.2 kg)   SpO2 96%   BMI 33.20 kg/m  , BMI Body mass index is 33.2 kg/m.  GENERAL:  Well appearing NECK:  No jugular venous distention, waveform within normal limits, carotid upstroke brisk and symmetric, no bruits, no thyromegaly LUNGS:  Clear to auscultation bilaterally CHEST:  Unremarkable HEART:  PMI not displaced or sustained,S1 and S2 within normal limits, no S3, no S4, no clicks, no rubs, no murmurs ABD:  Flat, positive bowel sounds normal in frequency in pitch, no bruits, no rebound, no guarding, no midline pulsatile mass, no hepatomegaly, no splenomegaly EXT:  2 plus pulses throughout, no edema, no cyanosis no clubbing n  EKG:  EKG is  ordered today. The ekg ordered today demonstrates sinus rhythm, rate 77, axis within normal limits, intervals within normal limits, no  acute ST-T wave changes.   Recent Labs: 02/08/2019: BUN 17; Creatinine, Ser 1.09; Hemoglobin 15.7; Platelets 272.0; Potassium 4.3; Sodium 139; TSH 2.37 12/30/2019: ALT 42    Lipid Panel    Component Value Date/Time   CHOL 123 12/30/2019 0848   TRIG 61 12/30/2019 0848   HDL 55 12/30/2019 0848   CHOLHDL 2.2 12/30/2019 0848   CHOLHDL 2 02/08/2019 0813   VLDL 15.0 02/08/2019 0813   LDLCALC 55 12/30/2019 0848   LDLDIRECT 51 12/30/2019 0848   LDLDIRECT 123 03/31/2015 0838      Wt Readings from Last 3 Encounters:  01/02/20 212 lb (96.2 kg)  02/13/19 218 lb (98.9 kg)  12/24/18 218 lb (98.9 kg)      Other studies Reviewed: Additional studies/ records that were reviewed today include: Labs Review of the above records demonstrates:  See elsewhere   ASSESSMENT AND PLAN:   CORONARY CALICIUM:   He had a negative POET (Plain Old Exercise Treadmill) with excellent exercise tolerance in 2020.  I will bring him back for another treadmill next year.  He is pursuing aggressive risk reduction.  No change in therapy.  He has an excellent diet.  He has an excellent exercise regimen.  He is on good medical therapy.  DYSLIPIDEMIA:    His LDL was 51.  HDL was 55.  No change in therapy.   HTN:   My goal for him is 120s/70s.  He is at that goal.  No change in therapy.  OVERWEIGHT:   He has continued to lose weight about 20 pounds over the last few years and I applaud that.   COVID EDUCATION: He has had his vaccine.   Current medicines are reviewed at length with the patient today.  The patient does not have concerns regarding medicines.  The following changes have been made:  As above  Labs/ tests ordered today include:    Orders Placed This Encounter  Procedures  . EXERCISE TOLERANCE TEST (ETT)     Disposition:   FU with me in one year.    Signed, Minus Breeding, MD  01/02/2020 9:40 AM    [facility]

## 2020-01-02 ENCOUNTER — Encounter: Payer: Self-pay | Admitting: Cardiology

## 2020-01-02 ENCOUNTER — Ambulatory Visit (INDEPENDENT_AMBULATORY_CARE_PROVIDER_SITE_OTHER): Payer: Self-pay | Admitting: Cardiology

## 2020-01-02 ENCOUNTER — Other Ambulatory Visit: Payer: Self-pay

## 2020-01-02 VITALS — BP 124/65 | HR 77 | Ht 67.0 in | Wt 212.0 lb

## 2020-01-02 DIAGNOSIS — I10 Essential (primary) hypertension: Secondary | ICD-10-CM

## 2020-01-02 DIAGNOSIS — E785 Hyperlipidemia, unspecified: Secondary | ICD-10-CM

## 2020-01-02 DIAGNOSIS — E663 Overweight: Secondary | ICD-10-CM

## 2020-01-02 DIAGNOSIS — R931 Abnormal findings on diagnostic imaging of heart and coronary circulation: Secondary | ICD-10-CM

## 2020-01-02 DIAGNOSIS — Z7189 Other specified counseling: Secondary | ICD-10-CM

## 2020-01-02 NOTE — Patient Instructions (Signed)
Medication Instructions:  Your physician recommends that you continue on your current medications as directed. Please refer to the Current Medication list given to you today.  *If you need a refill on your cardiac medications before your next appointment, please call your pharmacy*  Lab Work: NONE ordered at this time of appointment   If you have labs (blood work) drawn today and your tests are completely normal, you will receive your results only by: Marland Kitchen MyChart Message (if you have MyChart) OR . A paper copy in the mail If you have any lab test that is abnormal or we need to change your treatment, we will call you to review the results.  Testing/Procedures: Your physician has requested that you have an exercise tolerance test. For further information please visit [email or web address]. Please also follow instruction sheet, as given.  Please schedule for 1 year  Follow-Up: At [facility], you and your health needs are our priority.  As part of our continuing mission to provide you with exceptional heart care, we have created designated Provider Care Teams.  These Care Teams include your primary Cardiologist (physician) and Advanced Practice Providers (APPs -  Physician Assistants and Nurse Practitioners) who all work together to provide you with the care you need, when you need it.    Your next appointment:   1 year(s)  The format for your next appointment:   In Person  Provider:   Minus Breeding, MD  Other Instructions

## 2020-01-02 NOTE — Addendum Note (Signed)
Addended by: Jacqulynn Cadet on: 01/02/2020 11:48 AM   Modules accepted: Orders

## 2020-01-31 LAB — SARS-COV-2 ANTIBODIES: SARS-CoV-2 Antibodies: NEGATIVE

## 2020-02-11 ENCOUNTER — Other Ambulatory Visit: Payer: Self-pay | Admitting: Internal Medicine

## 2020-02-11 NOTE — Telephone Encounter (Signed)
Please refill as per office routine med refill policy (all routine meds refilled for 3 mo or monthly per pt preference up to one year from last visit, then month to month grace period for 3 mo, then further med refills will have to be denied)  

## 2020-02-12 ENCOUNTER — Other Ambulatory Visit (INDEPENDENT_AMBULATORY_CARE_PROVIDER_SITE_OTHER): Payer: Self-pay

## 2020-02-12 DIAGNOSIS — E559 Vitamin D deficiency, unspecified: Secondary | ICD-10-CM

## 2020-02-12 DIAGNOSIS — E538 Deficiency of other specified B group vitamins: Secondary | ICD-10-CM

## 2020-02-12 DIAGNOSIS — Z0000 Encounter for general adult medical examination without abnormal findings: Secondary | ICD-10-CM

## 2020-02-12 DIAGNOSIS — E611 Iron deficiency: Secondary | ICD-10-CM

## 2020-02-12 DIAGNOSIS — R739 Hyperglycemia, unspecified: Secondary | ICD-10-CM

## 2020-02-12 LAB — HEPATIC FUNCTION PANEL
ALT: 31 U/L (ref 0–53)
AST: 19 U/L (ref 0–37)
Albumin: 4.7 g/dL (ref 3.5–5.2)
Alkaline Phosphatase: 52 U/L (ref 39–117)
Bilirubin, Direct: 0.1 mg/dL (ref 0.0–0.3)
Total Bilirubin: 0.6 mg/dL (ref 0.2–1.2)
Total Protein: 6.6 g/dL (ref 6.0–8.3)

## 2020-02-12 LAB — URINALYSIS, ROUTINE W REFLEX MICROSCOPIC
Bilirubin Urine: NEGATIVE
Hgb urine dipstick: NEGATIVE
Ketones, ur: NEGATIVE
Leukocytes,Ua: NEGATIVE
Nitrite: NEGATIVE
RBC / HPF: NONE SEEN (ref 0–?)
Specific Gravity, Urine: 1.025 (ref 1.000–1.030)
Total Protein, Urine: NEGATIVE
Urine Glucose: NEGATIVE
Urobilinogen, UA: 0.2 (ref 0.0–1.0)
WBC, UA: NONE SEEN (ref 0–?)
pH: 6 (ref 5.0–8.0)

## 2020-02-12 LAB — HEMOGLOBIN A1C: Hgb A1c MFr Bld: 6 % (ref 4.6–6.5)

## 2020-02-12 LAB — CBC WITH DIFFERENTIAL/PLATELET
Basophils Absolute: 0.1 10*3/uL (ref 0.0–0.1)
Basophils Relative: 1.4 % (ref 0.0–3.0)
Eosinophils Absolute: 0.2 10*3/uL (ref 0.0–0.7)
Eosinophils Relative: 4.5 % (ref 0.0–5.0)
HCT: 46.1 % (ref 39.0–52.0)
Hemoglobin: 15.4 g/dL (ref 13.0–17.0)
Lymphocytes Relative: 31.8 % (ref 12.0–46.0)
Lymphs Abs: 1.7 10*3/uL (ref 0.7–4.0)
MCHC: 33.3 g/dL (ref 30.0–36.0)
MCV: 90.5 fl (ref 78.0–100.0)
Monocytes Absolute: 0.5 10*3/uL (ref 0.1–1.0)
Monocytes Relative: 9.2 % (ref 3.0–12.0)
Neutro Abs: 2.9 10*3/uL (ref 1.4–7.7)
Neutrophils Relative %: 53.1 % (ref 43.0–77.0)
Platelets: 247 10*3/uL (ref 150.0–400.0)
RBC: 5.09 Mil/uL (ref 4.22–5.81)
RDW: 13.2 % (ref 11.5–15.5)
WBC: 5.5 10*3/uL (ref 4.0–10.5)

## 2020-02-12 LAB — LIPID PANEL
Cholesterol: 109 mg/dL (ref 0–200)
HDL: 52.3 mg/dL (ref >39.00)
LDL Cholesterol: 40 mg/dL (ref 0–99)
NonHDL: 56.59
Total CHOL/HDL Ratio: 2
Triglycerides: 84 mg/dL (ref 0.0–149.0)
VLDL: 16.8 mg/dL (ref 0.0–40.0)

## 2020-02-12 LAB — BASIC METABOLIC PANEL
BUN: 14 mg/dL (ref 6–23)
CO2: 25 mEq/L (ref 19–32)
Calcium: 9.4 mg/dL (ref 8.4–10.5)
Chloride: 104 mEq/L (ref 96–112)
Creatinine, Ser: 0.93 mg/dL (ref 0.40–1.50)
GFR: 91.12 mL/min (ref >60.00)
Glucose, Bld: 112 mg/dL — ABNORMAL HIGH (ref 70–99)
Potassium: 4.2 mEq/L (ref 3.5–5.1)
Sodium: 138 mEq/L (ref 135–145)

## 2020-02-12 LAB — VITAMIN D 25 HYDROXY (VIT D DEFICIENCY, FRACTURES): VITD: 36.16 ng/mL (ref 30.00–100.00)

## 2020-02-12 LAB — PSA: PSA: 0.47 ng/mL (ref 0.10–4.00)

## 2020-02-12 LAB — IBC PANEL
Iron: 131 ug/dL (ref 42–165)
Saturation Ratios: 42.1 % (ref 20.0–50.0)
Transferrin: 222 mg/dL (ref 212.0–360.0)

## 2020-02-12 LAB — VITAMIN B12: Vitamin B-12: 381 pg/mL (ref 211–911)

## 2020-02-12 LAB — TSH: TSH: 1.68 u[IU]/mL (ref 0.35–4.50)

## 2020-02-14 ENCOUNTER — Encounter: Payer: Self-pay | Admitting: Internal Medicine

## 2020-02-14 ENCOUNTER — Ambulatory Visit (INDEPENDENT_AMBULATORY_CARE_PROVIDER_SITE_OTHER): Payer: 59 | Admitting: Internal Medicine

## 2020-02-14 ENCOUNTER — Other Ambulatory Visit: Payer: Self-pay

## 2020-02-14 VITALS — BP 142/90 | HR 72 | Temp 98.3°F | Ht 67.0 in | Wt 214.0 lb

## 2020-02-14 DIAGNOSIS — Z0000 Encounter for general adult medical examination without abnormal findings: Secondary | ICD-10-CM | POA: Diagnosis not present

## 2020-02-14 DIAGNOSIS — I10 Essential (primary) hypertension: Secondary | ICD-10-CM | POA: Diagnosis not present

## 2020-02-14 DIAGNOSIS — J069 Acute upper respiratory infection, unspecified: Secondary | ICD-10-CM

## 2020-02-14 DIAGNOSIS — M65331 Trigger finger, right middle finger: Secondary | ICD-10-CM | POA: Diagnosis not present

## 2020-02-14 DIAGNOSIS — R7302 Impaired glucose tolerance (oral): Secondary | ICD-10-CM

## 2020-02-14 DIAGNOSIS — Z0001 Encounter for general adult medical examination with abnormal findings: Secondary | ICD-10-CM

## 2020-02-14 MED ORDER — AZITHROMYCIN 250 MG PO TABS: ORAL_TABLET | ORAL | 0 refills | Status: DC

## 2020-02-14 NOTE — Patient Instructions (Signed)
Please take all new medication as prescribed - the zpack  Please continue all other medications as before, and refills have been done if requested.  Please have the pharmacy call with any other refills you may need.  Please continue your efforts at being more active, low cholesterol diet, and weight control.  You are otherwise up to date with prevention measures today.  Please keep your appointments with your specialists as you may have planned  You will be contacted regarding the referral for: Hand Surgury for after Apr 11 2020  Please make an Appointment to return for your 1 year visit, or sooner if needed, with Lab testing by Appointment as well, to be done about 3-5 days before at the [facility] (so this is for TWO appointments - please see the scheduling desk as you leave)

## 2020-02-14 NOTE — Progress Notes (Signed)
Subjective:    Patient ID: Brett Griffith, male    DOB: [birth year], 59 y.o.   MRN: [id number]  HPI    Here for wellness and f/u;  Overall doing ok;  Pt denies Chest pain, worsening SOB, DOE, wheezing, orthopnea, PND, worsening LE edema, palpitations, dizziness or syncope.  Pt denies neurological change such as new headache, facial or extremity weakness.  Pt denies polydipsia, polyuria, or low sugar symptoms. Pt states overall good compliance with treatment and medications, good tolerability, and has been trying to follow appropriate diet.  Pt denies worsening depressive symptoms, suicidal ideation or panic. No fever, night sweats, wt loss, loss of appetite, or other constitutional symptoms.  Pt states good ability with ADL's, has low fall risk, home safety reviewed and adequate, no other significant changes in hearing or vision, and only occasionally active with exercise. Having dental pain today, has appt next mon, now on tylenol and alleve.  BP usually better controlled BP Readings from Last 3 Encounters:  02/14/20 (!) 142/90  01/02/20 124/65  02/13/19 126/82  Also URI symptoms x 5 days, vaccinated, wife and he covid neg x 2 days.  Sees cardiology yearly.Also with right hand 3rd mcp trigger finger like clicking requiring manual manipulation, but does not want appt until after jan 1 due to insurance.   Past Medical History:  Diagnosis Date  . Allergy   . 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 Atypical nevus 02/26/2018   left post sholder superior--moderate  . History of kidney stones   . HYPERLIPIDEMIA 01/08/2009  . Impaired glucose tolerance 09/21/2010  . NEPHROLITHIASIS, HX OF 01/08/2009  . Shingles   . TIA (transient ischemic attack)    Past Surgical History:  Procedure Laterality Date  . INSERTION OF MESH N/A 08/10/2016   Procedure: INSERTION OF MESH;  Surgeon: Clovis Riley, MD;  Location: [place];  Service: General;  Laterality: N/A;  .  s/p ganglion cyst     right wrist  . s/p pilonidal cystectomy    . UMBILICAL HERNIA REPAIR N/A 08/10/2016   Procedure: UMBILICAL HERNIA REPAIR;  Surgeon: Clovis Riley, MD;  Location: [place];  Service: General;  Laterality: N/A;  . WISDOM TOOTH EXTRACTION      reports that he has never smoked. He has never used smokeless tobacco. He reports current alcohol use of about 2.0 standard drinks of alcohol per week. He reports that he does not use drugs. family history includes CAD (age of onset: 32) in his brother; CAD (age of onset: 35) in his father; Hypertension in his brother, father, and mother; Peripheral vascular disease (age of onset: 58) in his mother. Allergies  Allergen Reactions  . Ezetimibe-Simvastatin     REACTION: more emotional   Current Outpatient Medications on File Prior to Visit  Medication Sig Dispense Refill  . aspirin EC 81 MG tablet Take 1 tablet (81 mg total) by mouth daily. 90 tablet 11  . Coenzyme Q10 (CO Q-10 PO) Take 1 tablet by mouth daily.    . cyclobenzaprine (FLEXERIL) 5 MG tablet Take 1 tablet (5 mg total) by mouth 3 (three) times daily as needed for muscle spasms. 90 tablet 3  . LUTEIN-ZEAXANTHIN PO Take by mouth.    . Multiple Vitamin (MULTIVITAMIN) tablet Take 1 tablet by mouth daily.    Marland Kitchen OVER THE COUNTER MEDICATION Tumeric 100 mg. One tablet daily.    . rosuvastatin (CRESTOR) 40 MG tablet Take 1  tablet (40 mg total) by mouth daily. 90 tablet 3  . amLODipine (NORVASC) 2.5 MG tablet Take 1 tablet (2.5 mg total) by mouth daily. 180 tablet 3  . ezetimibe (ZETIA) 10 MG tablet TAKE 1 TABLET BY MOUTH EVERY DAY 90 tablet 3   No current facility-administered medications on file prior to visit.   Review of Systems All otherwise neg per pt    Objective:   Physical Exam BP (!) 142/90 (BP Location: Left Arm, Patient Position: Sitting, Cuff Size: Large)   Pulse 72   Temp 98.3 F (36.8 C) (Oral)   Ht 5\' 7"  (1.702 m)   Wt 214 lb (97.1 kg)    SpO2 97%   BMI 33.52 kg/m  VS noted,  Constitutional: Pt appears in NAD HENT: Head: NCAT.  Bilat tm's with mild erythema.  Max sinus areas mild tender.  Pharynx with mild erythema, no exudate Right Ear: External ear normal.  Left Ear: External ear normal.  Eyes: . Pupils are equal, round, and reactive to light. Conjunctivae and EOM are normal Nose: without d/c or deformity Neck: Neck supple. Gross normal ROM Cardiovascular: Normal rate and regular rhythm.   Pulmonary/Chest: Effort normal and breath sounds without rales or wheezing.  Abd:  Soft, NT, ND, + BS, no organomegaly Neurological: Pt is alert. At baseline orientation, motor grossly intact Right hand 3rd mcp with mild swelling, tender, click with finger flex and extend Skin: Skin is warm. No rashes, other new lesions, no LE edema Psychiatric: Pt behavior is normal without agitation  All otherwise neg per pt Lab Results  Component Value Date   WBC 5.5 02/12/2020   HGB 15.4 02/12/2020   HCT 46.1 02/12/2020   PLT 247.0 02/12/2020   GLUCOSE 112 (H) 02/12/2020   CHOL 109 02/12/2020   TRIG 84.0 02/12/2020   HDL 52.30 02/12/2020   LDLDIRECT 51 12/30/2019   LDLCALC 40 02/12/2020   ALT 31 02/12/2020   AST 19 02/12/2020   NA 138 02/12/2020   K 4.2 02/12/2020   CL 104 02/12/2020   CREATININE 0.93 02/12/2020   BUN 14 02/12/2020   CO2 25 02/12/2020   TSH 1.68 02/12/2020   PSA 0.47 02/12/2020   INR 0.97 10/07/2009   HGBA1C 6.0 02/12/2020      Assessment & Plan:

## 2020-02-15 ENCOUNTER — Encounter: Payer: Self-pay | Admitting: Internal Medicine

## 2020-02-15 MED ORDER — AMLODIPINE BESYLATE 2.5 MG PO TABS: 2.5000 mg | ORAL_TABLET | Freq: Every day | ORAL | 3 refills | Status: DC

## 2020-02-15 NOTE — Assessment & Plan Note (Signed)

## 2020-02-15 NOTE — Assessment & Plan Note (Addendum)
Ok for hand surgury referral as per pt request  I spent 31 minutes in addition to time for CPX wellness examination in preparing to see the patient by review of recent labs, imaging and procedures, obtaining and reviewing separately obtained history, communicating with the patient and family or caregiver, ordering medications, tests or procedures, and documenting clinical information in the EHR including the differential Dx, treatment, and any further evaluation and other management of trigger finger, htn, hyperglycemia, uri

## 2020-02-15 NOTE — Addendum Note (Signed)
Addended by: Biagio Borg on: 02/15/2020 03:21 PM   Modules accepted: Orders

## 2020-02-15 NOTE — Assessment & Plan Note (Signed)
stable overall by history and exam, recent data reviewed with pt, and pt to continue medical treatment as before,  to f/u any worsening symptoms or concerns  

## 2020-02-15 NOTE — Assessment & Plan Note (Signed)
For rerstart amlod 2.5 qd

## 2020-02-15 NOTE — Assessment & Plan Note (Signed)
Mild to mod, for antibx course,  to f/u any worsening symptoms or concerns 

## 2020-02-26 ENCOUNTER — Other Ambulatory Visit: Payer: Self-pay | Admitting: Internal Medicine

## 2020-02-26 NOTE — Telephone Encounter (Signed)
Please refill as per office routine med refill policy (all routine meds refilled for 3 mo or monthly per pt preference up to one year from last visit, then month to month grace period for 3 mo, then further med refills will have to be denied)  

## 2020-04-22 DIAGNOSIS — G5601 Carpal tunnel syndrome, right upper limb: Secondary | ICD-10-CM | POA: Insufficient documentation

## 2020-08-19 NOTE — Progress Notes (Signed)
[place] [address] [place] Phone: [phone] Subjective:   I Brett Griffith am serving as a Education administrator for Dr. Hulan Saas.  This visit occurred during the SARS-CoV-2 public health emergency.  Safety protocols were in place, including screening questions prior to the visit, additional usage of staff PPE, and extensive cleaning of exam room while observing appropriate contact time as indicated for disinfecting solutions.   I'm seeing this patient by the request  of:  Biagio Borg, MD  CC: knee pain   [id number]  Brett Griffith is a 60 y.o. male coming in with complaint of right knee pain. Patient states he does not remember the MOI. States he doesn't have pain just popping. In the evenings he feels a dull sensation.  Patient says it does not seem to get worse with stairs.  Actually seems to be worse after a lot of activity and being on a flat surface.  No instability.  No weakness.  Onset- 10 days  Location -medial aspect of the knee Duration-  Character-no pain just an audible pop Aggravating factors- walking  Reliving factors-  Therapies tried- biofreeze aleve  Severity-0 out of 10     Past Medical History:  Diagnosis Date  . Allergy   . 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 Atypical nevus 02/26/2018   left post sholder superior--moderate  . History of kidney stones   . HYPERLIPIDEMIA 01/08/2009  . Impaired glucose tolerance 09/21/2010  . NEPHROLITHIASIS, HX OF 01/08/2009  . Shingles   . TIA (transient ischemic attack)    Past Surgical History:  Procedure Laterality Date  . INSERTION OF MESH N/A 08/10/2016   Procedure: INSERTION OF MESH;  Surgeon: Clovis Riley, MD;  Location: [facility];  Service: General;  Laterality: N/A;  . s/p ganglion cyst     right wrist  . s/p pilonidal cystectomy    . UMBILICAL HERNIA REPAIR N/A 08/10/2016   Procedure: UMBILICAL HERNIA REPAIR;   Surgeon: Clovis Riley, MD;  Location: [facility];  Service: General;  Laterality: N/A;  . WISDOM TOOTH EXTRACTION     Social History   Socioeconomic History  . Marital status: Married    Spouse name: Not on file  . Number of children: 3  . Years of education: Not on file  . Highest education level: Not on file  Occupational History  . Occupation: Engineer, structural    Comment: Retired  Tobacco Use  . Smoking status: Never Smoker  . Smokeless tobacco: Never Used  Vaping Use  . Vaping Use: Never used  Substance and Sexual Activity  . Alcohol use: Yes    Alcohol/week: 2.0 standard drinks    Types: 2 Cans of beer per week  . Drug use: No  . Sexual activity: Not on file  Other Topics Concern  . Not on file  Social History Narrative   Three children and one wife.     Social Determinants of Health   Financial Resource Strain: Not on file  Food Insecurity: Not on file  Transportation Needs: Not on file  Physical Activity: Not on file  Stress: Not on file  Social Connections: Not on file   Allergies  Allergen Reactions  . Ezetimibe-Simvastatin     REACTION: more emotional   Family History  Problem Relation Age of Onset  . Peripheral vascular disease Mother 3  . Hypertension Mother   . CAD Father  64       CABG  . Hypertension Father   . Hypertension Brother   . CAD Brother 6       CABG  . Colon cancer Neg Hx   . Esophageal cancer Neg Hx   . Pancreatic cancer Neg Hx   . Rectal cancer Neg Hx   . Stomach cancer Neg Hx   . Liver cancer Neg Hx      Current Outpatient Medications (Cardiovascular):  .  ezetimibe (ZETIA) 10 MG tablet, TAKE 1 TABLET BY MOUTH EVERY DAY .  rosuvastatin (CRESTOR) 40 MG tablet, TAKE 1 TABLET BY MOUTH EVERY DAY .  amLODipine (NORVASC) 2.5 MG tablet, Take 1 tablet (2.5 mg total) by mouth daily.   Current Outpatient Medications (Analgesics):  .  aspirin EC 81 MG tablet, Take 1 tablet (81 mg total) by mouth  daily.   Current Outpatient Medications (Other):  .  azithromycin (ZITHROMAX) 250 MG tablet, 2 tab by mouth day 1 , then 1 per day .  Coenzyme Q10 (CO Q-10 PO), Take 1 tablet by mouth daily. .  cyclobenzaprine (FLEXERIL) 5 MG tablet, Take 1 tablet (5 mg total) by mouth 3 (three) times daily as needed for muscle spasms. .  LUTEIN-ZEAXANTHIN PO*, Take by mouth. .  Multiple Vitamin (MULTIVITAMIN) tablet, Take 1 tablet by mouth daily. Marland Kitchen  OVER THE COUNTER MEDICATION, Tumeric 100 mg. One tablet daily. * These medications belong to multiple therapeutic classes and are listed under each applicable group.   Reviewed prior external information including notes and imaging from  primary care provider As well as notes that were available from care everywhere and other healthcare systems.  Past medical history, social, surgical and family history all reviewed in electronic medical record.  No pertanent information unless stated regarding to the chief complaint.   Review of Systems:  No headache, visual changes, nausea, vomiting, diarrhea, constipation, dizziness, abdominal pain, skin rash, fevers, chills, night sweats, weight loss, swollen lymph nodes, body aches, joint swelling, chest pain, shortness of breath, mood changes. POSITIVE muscle aches  Objective  Blood pressure 136/72, pulse 83, height 5\' 7"  (1.702 m), weight 210 lb (95.3 kg), SpO2 98 %.   General: No apparent distress alert and oriented x3 mood and affect normal, dressed appropriately.  HEENT: Pupils equal, extraocular movements intact  Respiratory: Patient's speak in full sentences and does not appear short of breath  Cardiovascular: No lower extremity edema, non tender, no erythema  Gait normal with good balance and coordination.  MSK:   Knee exam shows the patient does have full range of motion.  Good strength in the VMO.  Patient does have an audible popping noted.  Does seem to be on the medial aspect of the patella.  No  instability of the knee noted.  Negative McMurray's.  Limited musculoskeletal ultrasound was performed and interpreted by Lyndal Pulley  Limited ultrasound of patient's right knee shows the patient does have trace effusion noted of the patellofemoral joint noted.  Patient does have what appears to be more of a lateral chronic tear with mild displacement but not in the area where patient has pain.  Mild narrowing of the patellofemoral joint noted. 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

## 2020-08-20 ENCOUNTER — Ambulatory Visit (INDEPENDENT_AMBULATORY_CARE_PROVIDER_SITE_OTHER): Payer: 59 | Admitting: Family Medicine

## 2020-08-20 ENCOUNTER — Ambulatory Visit: Payer: Self-pay

## 2020-08-20 ENCOUNTER — Ambulatory Visit (INDEPENDENT_AMBULATORY_CARE_PROVIDER_SITE_OTHER): Payer: 59

## 2020-08-20 ENCOUNTER — Other Ambulatory Visit: Payer: Self-pay

## 2020-08-20 ENCOUNTER — Encounter: Payer: Self-pay | Admitting: Family Medicine

## 2020-08-20 VITALS — BP 136/72 | HR 83 | Ht 67.0 in | Wt 210.0 lb

## 2020-08-20 DIAGNOSIS — M25561 Pain in right knee: Secondary | ICD-10-CM | POA: Diagnosis not present

## 2020-08-20 NOTE — Patient Instructions (Signed)
Good to see you Likely the knee cap Xray of the right knee today Exercise 3 times a week pennsaid for your trip Use voltaren 2 times a day on the knee Ice after mowing lawn See me again in 6 weeks just in case

## 2020-08-20 NOTE — Assessment & Plan Note (Signed)
Patient does not have true pain in the right knee.  Does seem to have very mild patellofemoral arthritis noted.  We will get x-rays to further evaluate for any type of loose body.  Ultrasound does show very mild lateral meniscus tear but the audible popping is on the medial aspect patient does have a trip but I do not think that this will likely limit him.  Very mild effusion noted of the patellofemoral joint and will try topical anti-inflammatories and icing regimen.  Follow-up with me again in 6 weeks otherwise

## 2020-10-09 ENCOUNTER — Ambulatory Visit: Payer: 59 | Admitting: Family Medicine

## 2020-11-10 ENCOUNTER — Other Ambulatory Visit (HOSPITAL_BASED_OUTPATIENT_CLINIC_OR_DEPARTMENT_OTHER): Payer: Self-pay | Admitting: *Deleted

## 2020-11-10 DIAGNOSIS — R931 Abnormal findings on diagnostic imaging of heart and coronary circulation: Secondary | ICD-10-CM

## 2020-12-07 ENCOUNTER — Telehealth: Payer: Self-pay | Admitting: Cardiology

## 2020-12-07 DIAGNOSIS — E785 Hyperlipidemia, unspecified: Secondary | ICD-10-CM

## 2020-12-07 NOTE — Telephone Encounter (Signed)
Will route to [employer], PA-C to see if labs are needed.

## 2020-12-07 NOTE — Telephone Encounter (Signed)
Patient would like to know if lab work is needed prior to his appt on 11/03 with Caron Presume.

## 2020-12-10 NOTE — Telephone Encounter (Signed)
Returned call to patient, made patient aware that lab order has been placed. ETT already scheduled for patient on 10/4. Advised patient to call back to office with any issues, questions, or concerns. Patient verbalized understanding.    Lab order placed.

## 2020-12-18 NOTE — Progress Notes (Signed)
[place] Phone: [phone] Subjective:   Fontaine No, am serving as a scribe for Dr. Hulan Saas. This visit occurred during the SARS-CoV-2 public health emergency.  Safety protocols were in place, including screening questions prior to the visit, additional usage of staff PPE, and extensive cleaning of exam room while observing appropriate contact time as indicated for disinfecting solutions.   I'm seeing this patient by the request  of:  Biagio Borg, MD  CC: Left shoulder pain  [id number]  Last seen in May 2022 for knee pain. Brett Griffith is a 60 y.o. male coming in with complaint of left shoulder pain. Over pec and bicep. Patient got a new mattress and pain began. Pain with driving and with flexion. Pain can be sharp but typically achy. Pain went away when he went to sleep at his parents house. Has had chiro work on muscles in that area.      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   moderate-severe right post neck   Atypical nevus 02/26/2018   left post sholder superior--moderate   History of kidney stones    HYPERLIPIDEMIA 01/08/2009   Impaired glucose tolerance 09/21/2010   NEPHROLITHIASIS, HX OF 01/08/2009   Shingles    TIA (transient ischemic attack)    Past Surgical History:  Procedure Laterality Date   INSERTION OF MESH N/A 08/10/2016   Procedure: INSERTION OF MESH;  Surgeon: Clovis Riley, MD;  Location: [facility];  Service: General;  Laterality: N/A;   s/p ganglion cyst     right wrist   s/p pilonidal cystectomy     UMBILICAL HERNIA REPAIR N/A 08/10/2016   Procedure: UMBILICAL HERNIA REPAIR;  Surgeon: Clovis Riley, MD;  Location: [facility];  Service: General;  Laterality: N/A;   WISDOM TOOTH EXTRACTION     Social History   Socioeconomic History   Marital status: Married    Spouse name: Not on file    Number of children: 3   Years of education: Not on file   Highest education level: Not on file  Occupational History   Occupation: Engineer, structural    Comment: Retired  Tobacco Use   Smoking status: Never   Smokeless tobacco: Never  Vaping Use   Vaping Use: Never used  Substance and Sexual Activity   Alcohol use: Yes    Alcohol/week: 2.0 standard drinks    Types: 2 Cans of beer per week   Drug use: No   Sexual activity: Not on file  Other Topics Concern   Not on file  Social History Narrative   Three children and one wife.     Social Determinants of Health   Financial Resource Strain: Not on file  Food Insecurity: Not on file  Transportation Needs: Not on file  Physical Activity: Not on file  Stress: Not on file  Social Connections: Not on file   Allergies  Allergen Reactions   Ezetimibe-Simvastatin     REACTION: more emotional   Family History  Problem Relation Age of Onset   Peripheral vascular disease Mother 4   Hypertension Mother    CAD Father 74       CABG   Hypertension Father    Hypertension Brother    CAD Brother 47       CABG   Colon cancer Neg Hx    Esophageal cancer  Neg Hx    Pancreatic cancer Neg Hx    Rectal cancer Neg Hx    Stomach cancer Neg Hx    Liver cancer Neg Hx      Current Outpatient Medications (Cardiovascular):    ezetimibe (ZETIA) 10 MG tablet, TAKE 1 TABLET BY MOUTH EVERY DAY   rosuvastatin (CRESTOR) 40 MG tablet, TAKE 1 TABLET BY MOUTH EVERY DAY   amLODipine (NORVASC) 2.5 MG tablet, Take 1 tablet (2.5 mg total) by mouth daily.   Current Outpatient Medications (Analgesics):    aspirin EC 81 MG tablet, Take 1 tablet (81 mg total) by mouth daily.   Current Outpatient Medications (Other):    azithromycin (ZITHROMAX) 250 MG tablet, 2 tab by mouth day 1 , then 1 per day   Coenzyme Q10 (CO Q-10 PO), Take 1 tablet by mouth daily.   cyclobenzaprine (FLEXERIL) 5 MG tablet, Take 1 tablet (5 mg total) by mouth 3 (three) times daily  as needed for muscle spasms.   Diclofenac Sodium 2 % SOLN, Place 2 g onto the skin 2 (two) times daily.   LUTEIN-ZEAXANTHIN PO*, Take by mouth.   Multiple Vitamin (MULTIVITAMIN) tablet, Take 1 tablet by mouth daily.   OVER THE COUNTER MEDICATION, Tumeric 100 mg. One tablet daily. * These medications belong to multiple therapeutic classes and are listed under each applicable group.   Reviewed prior external information including notes and imaging from  primary care provider As well as notes that were available from care everywhere and other healthcare systems.  Past medical history, social, surgical and family history all reviewed in electronic medical record.  No pertanent information unless stated regarding to the chief complaint.   Review of Systems:  No headache, visual changes, nausea, vomiting, diarrhea, constipation, dizziness, abdominal pain, skin rash, fevers, chills, night sweats, weight loss, swollen lymph nodes, body aches, joint swelling, chest pain, shortness of breath, mood changes. POSITIVE muscle aches  Objective  Blood pressure 140/82, pulse 88, height 5\' 7"  (1.702 m), weight 212 lb (96.2 kg), SpO2 95 %.   General: No apparent distress alert and oriented x3 mood and affect normal, dressed appropriately.  HEENT: Pupils equal, extraocular movements intact  Respiratory: Patient's speak in full sentences and does not appear short of breath  Cardiovascular: No lower extremity edema, non tender, no erythema  Gait normal with good balance and coordination.  MSK: Left shoulder exam shows the patient does have positive impingement noted.  Rotator cuff strength seems to be frail but does have pain with empty can.  Mild positive O'Brien's as well.  Limited muscular skeletal ultrasound was performed and interpreted by Hulan Saas, M  Limited ultrasound shows the patient does have hypoechoic changes within the tendon sheath of the bicep tendon.  Patient does have some hypoechoic  changes consistent with tendinitis of the supraspinatus but no true tearing appreciated.  Mild arthritic changes of the acromioclavicular joint noted. Impression: Bicep tendinitis with rotator cuff pathology  [id number]; 15 additional minutes spent for Therapeutic exercises as stated in above notes.  This included exercises focusing on stretching, strengthening, with significant focus on eccentric aspects.   Long term goals include an improvement in range of motion, strength, endurance as well as avoiding reinjury. Patient's frequency would include in 1-2 times a day, 3-5 times a week for a duration of 6-12 weeks. Shoulder Exercises that included:  Basic scapular stabilization to include adduction and depression of scapula Scaption, focusing on proper movement and good control Internal and External rotation utilizing  a theraband, with elbow tucked at side entire time Rows with theraband   Proper technique shown and discussed handout in great detail with ATC.  All questions were discussed and answered.      Impression and Recommendations:     The above documentation has been reviewed and is accurate and complete Lyndal Pulley, DO

## 2020-12-21 ENCOUNTER — Other Ambulatory Visit: Payer: Self-pay

## 2020-12-21 ENCOUNTER — Ambulatory Visit: Payer: Managed Care, Other (non HMO) | Admitting: Family Medicine

## 2020-12-21 ENCOUNTER — Encounter: Payer: Self-pay | Admitting: Family Medicine

## 2020-12-21 ENCOUNTER — Ambulatory Visit: Payer: Self-pay

## 2020-12-21 VITALS — BP 140/82 | HR 88 | Ht 67.0 in | Wt 212.0 lb

## 2020-12-21 DIAGNOSIS — M25512 Pain in left shoulder: Secondary | ICD-10-CM

## 2020-12-21 MED ORDER — DICLOFENAC SODIUM 2 % EX SOLN: 2.0000 g | Freq: Two times a day (BID) | CUTANEOUS | 3 refills | Status: DC

## 2020-12-21 NOTE — Assessment & Plan Note (Signed)
Patient does have a bicep tendinitis noted.  He has some difficulty with history For some time.  Given home exercises, will prescribe topical anti-inflammatories.  Increase activity slowly follow-up again 6 weeks if worsening pain consider injection and formal physical therapy.

## 2020-12-21 NOTE — Patient Instructions (Signed)
Bursitis exercises Ice 20 min 2x a day See me again in 5-6 weeks if not better will inject

## 2020-12-24 ENCOUNTER — Telehealth (HOSPITAL_COMMUNITY): Payer: Self-pay | Admitting: *Deleted

## 2020-12-24 NOTE — Telephone Encounter (Signed)
Close encounter 

## 2020-12-29 ENCOUNTER — Ambulatory Visit (HOSPITAL_COMMUNITY)
Admission: RE | Admit: 2020-12-29 | Discharge: 2020-12-29 | Disposition: A | Discharge status: Home / Self Care | Payer: Managed Care, Other (non HMO) | Source: Ambulatory Visit | Attending: Cardiovascular Disease | Admitting: Cardiovascular Disease

## 2020-12-29 ENCOUNTER — Other Ambulatory Visit: Payer: Self-pay

## 2020-12-29 DIAGNOSIS — R931 Abnormal findings on diagnostic imaging of heart and coronary circulation: Secondary | ICD-10-CM | POA: Insufficient documentation

## 2020-12-29 LAB — EXERCISE TOLERANCE TEST
Angina Index: 0
Duke Treadmill Score: 10
Estimated workload: 11.7
Exercise duration (min): 10 min
Exercise duration (sec): 3 s
MPHR: 162 {beats}/min
Peak HR: 157 {beats}/min
Percent HR: 96 %
Rest HR: 75 {beats}/min
ST Depression (mm): 0 mm

## 2021-01-21 LAB — LIPID PANEL
Chol/HDL Ratio: 2 ratio (ref 0.0–5.0)
Cholesterol, Total: 126 mg/dL (ref 100–199)
HDL: 63 mg/dL (ref >39)
LDL Chol Calc (NIH): 48 mg/dL (ref 0–99)
Triglycerides: 73 mg/dL (ref 0–149)
VLDL Cholesterol Cal: 15 mg/dL (ref 5–40)

## 2021-01-27 NOTE — Progress Notes (Signed)
Cardiology Office Note:    Date:  01/28/2021   ID:  Brett, Griffith 10-May-1960, MRN [id number]  PCP:  Biagio Borg, MD [place] Cardiologist: Minus Breeding, MD   Reason for visit: 1 year follow-up  History of Present Illness:    Brett Griffith is a 60 y.o. male with a hx of elevated coronary calcium.  He was initially seen by Dr. Percival Spanish in 2016 for shortness of breath.  He has had negative stress test with focus on primary prevention.  He last saw Dr. Percival Spanish October 2021.  Patient denies any angina.  He mentioned he exercises the gym at [employer] every morning with both treadmill and strength training.  Patient had an exercise tolerance test last month.  Patient exercised for 10 minutes, max heart rate 157, peak METS 11.7, no angina during the test.  No evidence of ischemia.  Today, he states he is doing great.  He denies chest pain, shortness of breath, PND, orthopnea, lower extremity edema, palpitations, lightheadedness, syncope and bleeding issues.  He is tolerating his medications well.  He is seeing a nutritionist for prediabetes.  He thinks that is likely due to to his abdominal obesity.  He has an annual physical at the end month.  He likes to follow-up with [place] once a year given extensive family history of coronary and peripheral vascular disease.    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   moderate-severe right post neck   Atypical nevus 02/26/2018   left post sholder superior--moderate   History of kidney stones    HYPERLIPIDEMIA 01/08/2009   Impaired glucose tolerance 09/21/2010   NEPHROLITHIASIS, HX OF 01/08/2009   Shingles    TIA (transient ischemic attack)     Past Surgical History:  Procedure Laterality Date   INSERTION OF MESH N/A 08/10/2016   Procedure: INSERTION OF MESH;  Surgeon: Clovis Riley, MD;  Location: [facility];  Service: General;  Laterality: N/A;   s/p ganglion cyst     right  wrist   s/p pilonidal cystectomy     UMBILICAL HERNIA REPAIR N/A 08/10/2016   Procedure: UMBILICAL HERNIA REPAIR;  Surgeon: Clovis Riley, MD;  Location: [facility];  Service: General;  Laterality: N/A;   WISDOM TOOTH EXTRACTION      Current Medications: Current Meds  Medication Sig   aspirin EC 81 MG tablet Take 1 tablet (81 mg total) by mouth daily.   azithromycin (ZITHROMAX) 250 MG tablet 2 tab by mouth day 1 , then 1 per day   Coenzyme Q10 (CO Q-10 PO) Take 1 tablet by mouth daily.   cyclobenzaprine (FLEXERIL) 5 MG tablet Take 1 tablet (5 mg total) by mouth 3 (three) times daily as needed for muscle spasms.   Diclofenac Sodium 2 % SOLN Place 2 g onto the skin 2 (two) times daily.   ezetimibe (ZETIA) 10 MG tablet TAKE 1 TABLET BY MOUTH EVERY DAY   LUTEIN-ZEAXANTHIN PO Take by mouth.   Multiple Vitamin (MULTIVITAMIN) tablet Take 1 tablet by mouth daily.   OVER THE COUNTER MEDICATION Tumeric 100 mg. One tablet daily.   rosuvastatin (CRESTOR) 40 MG tablet TAKE 1 TABLET BY MOUTH EVERY DAY     Allergies:   Ezetimibe-simvastatin   Social History   Socioeconomic History   Marital status: Married    Spouse name: Not on file   Number of children: 3   Years of  education: Not on file   Highest education level: Not on file  Occupational History   Occupation: Engineer, structural    Comment: Retired  Tobacco Use   Smoking status: Never   Smokeless tobacco: Never  Vaping Use   Vaping Use: Never used  Substance and Sexual Activity   Alcohol use: Yes    Alcohol/week: 2.0 standard drinks    Types: 2 Cans of beer per week   Drug use: No   Sexual activity: Not on file  Other Topics Concern   Not on file  Social History Narrative   Three children and one wife.     Social Determinants of Health   Financial Resource Strain: Not on file  Food Insecurity: Not on file  Transportation Needs: Not on file  Physical Activity: Not on file  Stress: Not on file  Social  Connections: Not on file     Family History: The patient's family history includes CAD (age of onset: 4) in his brother; CAD (age of onset: 26) in his father; Hypertension in his brother, father, and mother; Peripheral vascular disease (age of onset: 9) in his mother. There is no history of Colon cancer, Esophageal cancer, Pancreatic cancer, Rectal cancer, Stomach cancer, or Liver cancer.  ROS:   Please see the history of present illness.     EKGs/Labs/Other Studies Reviewed:    EKG:  The ekg ordered today demonstrates normal sinus rhythm, nonspecific T wave abnormality, heart rate 78, QRS duration 92 ms.  Recent Labs: 02/12/2020: ALT 31; BUN 14; Creatinine, Ser 0.93; Hemoglobin 15.4; Platelets 247.0; Potassium 4.2; Sodium 138; TSH 1.68   Recent Lipid Panel Lab Results  Component Value Date/Time   CHOL 126 01/21/2021 09:39 AM   TRIG 73 01/21/2021 09:39 AM   HDL 63 01/21/2021 09:39 AM   LDLCALC 48 01/21/2021 09:39 AM   LDLDIRECT 51 12/30/2019 08:48 AM   LDLDIRECT 123 03/31/2015 08:38 AM    Physical Exam:    VS:  BP 121/65   Pulse 78   Ht 5\' 4"  (1.626 m)   Wt 215 lb 12.8 oz (97.9 kg)   SpO2 97%   BMI 37.04 kg/m    No data found.  Wt Readings from Last 3 Encounters:  01/28/21 215 lb 12.8 oz (97.9 kg)  12/21/20 212 lb (96.2 kg)  08/20/20 210 lb (95.3 kg)     GEN:  Well nourished, well developed in no acute distress, built HEENT: Normal NECK: No JVD; No carotid bruits CARDIAC: RRR, no murmurs, rubs, gallops RESPIRATORY:  Clear to auscultation without rales, wheezing or rhonchi  ABDOMEN: Soft, non-tender, non-distended MUSCULOSKELETAL: No edema; No deformity  SKIN: Warm and dry NEUROLOGIC:  Alert and oriented PSYCHIATRIC:  Normal affect     ASSESSMENT AND PLAN   Coronary calcium -Focus on primary prevention. -Continue blood pressure and lipid control.  He is working on [employer] and diet with his nutritionist. -We typically check exercise tolerance test every 2  years, due in 2024.    Hypertension, controlled -Dr. Rosezella [place] goal blood pressure for him is 120s over 70s. -Continue low-dose amlodipine. -Recommend DASH diet (high in vegetables, fruits, low-fat dairy products, whole grains, poultry, fish, and nuts and low in sweets, sugar-sweetened beverages, and red meats), salt restriction and increase physical activity.  Hyperlipidemia -LDL 48 in October 2022.  Continue Crestor and Zetia. -Discussed cholesterol lowering diets - Mediterranean diet, DASH diet, vegetarian diet, low-carbohydrate diet and avoidance of trans fats.  Discussed healthier choice substitutes.  Nuts, high-fiber  foods, and fiber supplements may also improve lipids.    Obesity -Discussed how even a 5-10% weight loss can have cardiovascular benefits.   -Recommend moderate intensity activity for 30 minutes 5 days/week and the DASH diet.  Disposition - Follow-up in 1 year with Dr. Percival Spanish.         Medication Adjustments/Labs and Tests Ordered: Current medicines are reviewed at length with the patient today.  Concerns regarding medicines are outlined above.  Orders Placed This Encounter  Procedures   EKG 12-Lead    No orders of the defined types were placed in this encounter.   Patient Instructions  Medication Instructions:   No changes *If you need a refill on your cardiac medications before your next appointment, please call your pharmacy*   Lab Work: Not needed    Testing/Procedures:  Not  needed  Follow-Up: At [facility], you and your health needs are our priority.  As part of our continuing mission to provide you with exceptional heart care, we have created designated Provider Care Teams.  These Care Teams include your primary Cardiologist (physician) and Advanced Practice Providers (APPs -  Physician Assistants and Nurse Practitioners) who all work together to provide you with the care you need, when you need it.     Your next appointment:   12  month(s)  The format for your next appointment:   In Person  Provider:   Minus Breeding, MD     Signed, Brett Lacy, PA-C  01/28/2021 11:00 AM    [facility]

## 2021-01-28 ENCOUNTER — Other Ambulatory Visit: Payer: Self-pay

## 2021-01-28 ENCOUNTER — Ambulatory Visit (INDEPENDENT_AMBULATORY_CARE_PROVIDER_SITE_OTHER): Payer: Managed Care, Other (non HMO) | Admitting: Physician Assistant

## 2021-01-28 VITALS — BP 121/65 | HR 78 | Ht 64.0 in | Wt 215.8 lb

## 2021-01-28 DIAGNOSIS — E785 Hyperlipidemia, unspecified: Secondary | ICD-10-CM

## 2021-01-28 DIAGNOSIS — E663 Overweight: Secondary | ICD-10-CM | POA: Diagnosis not present

## 2021-01-28 DIAGNOSIS — I10 Essential (primary) hypertension: Secondary | ICD-10-CM

## 2021-01-28 DIAGNOSIS — R931 Abnormal findings on diagnostic imaging of heart and coronary circulation: Secondary | ICD-10-CM

## 2021-01-28 NOTE — Patient Instructions (Signed)
Medication Instructions:   No changes  *If you need a refill on your cardiac medications before your next appointment, please call your pharmacy*   Lab Work: Not needed    Testing/Procedures:  Not needed  Follow-Up: At [facility], you and your health needs are our priority.  As part of our continuing mission to provide you with exceptional heart care, we have created designated Provider Care Teams.  These Care Teams include your primary Cardiologist (physician) and Advanced Practice Providers (APPs -  Physician Assistants and Nurse Practitioners) who all work together to provide you with the care you need, when you need it.     Your next appointment:   12 month(s)  The format for your next appointment:   In Person  Provider:   James Hochrein, MD   

## 2021-02-02 NOTE — Progress Notes (Signed)
Brett Griffith [place] [address] [place] Phone: [phone] Subjective:   IVilma Griffith, am serving as a scribe for Dr. Hulan Saas. This visit occurred during the SARS-CoV-2 public health emergency.  Safety protocols were in place, including screening questions prior to the visit, additional usage of staff PPE, and extensive cleaning of exam room while observing appropriate contact time as indicated for disinfecting solutions.   I'm seeing this patient by the request  of:  Biagio Borg, MD  CC: Bilateral shoulder pain  EYC:[id number]  12/21/2020 Patient does have a bicep tendinitis noted.  He has some difficulty with history For some time.  Given home exercises, will prescribe topical anti-inflammatories.  Increase activity slowly follow-up again 6 weeks if worsening pain consider injection and formal physical therapy.  Updated 02/03/2021 QUITMAN Griffith is a 60 y.o. male coming in with complaint of left shoulder pain. Does okay for about 6-8 hours after therapy, but once he makes a wrong move pain goes back to square one. Right shoulder now beginning to hurt. Uses voltaren, aleve, and tylenol pm on a daily basis. Waking him up at night as well.  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   moderate-severe right post neck   Atypical nevus 02/26/2018   left post sholder superior--moderate   History of kidney stones    HYPERLIPIDEMIA 01/08/2009   Impaired glucose tolerance 09/21/2010   NEPHROLITHIASIS, HX OF 01/08/2009   Shingles    TIA (transient ischemic attack)    Past Surgical History:  Procedure Laterality Date   INSERTION OF MESH N/A 08/10/2016   Procedure: INSERTION OF MESH;  Surgeon: Clovis Riley, MD;  Location: [place] [facility];  Service: General;  Laterality: N/A;   s/p ganglion cyst     right wrist   s/p pilonidal cystectomy     UMBILICAL HERNIA REPAIR  N/A 08/10/2016   Procedure: UMBILICAL HERNIA REPAIR;  Surgeon: Clovis Riley, MD;  Location: [place];  Service: General;  Laterality: N/A;   WISDOM TOOTH EXTRACTION     Social History   Socioeconomic History   Marital status: Married    Spouse name: Not on file   Number of children: 3   Years of education: Not on file   Highest education level: Not on file  Occupational History   Occupation: Engineer, structural    Comment: Retired  Tobacco Use   Smoking status: Never   Smokeless tobacco: Never  Vaping Use   Vaping Use: Never used  Substance and Sexual Activity   Alcohol use: Yes    Alcohol/week: 2.0 standard drinks    Types: 2 Cans of beer per week   Drug use: No   Sexual activity: Not on file  Other Topics Concern   Not on file  Social History Narrative   Three children and one wife.     Social Determinants of Health   Financial Resource Strain: Not on file  Food Insecurity: Not on file  Transportation Needs: Not on file  Physical Activity: Not on file  Stress: Not on file  Social Connections: Not on file   Allergies  Allergen Reactions   Ezetimibe-Simvastatin     REACTION: more emotional   Family History  Problem Relation Age of Onset   Peripheral vascular disease Mother 33   Hypertension Mother    CAD Father 44  CABG   Hypertension Father    Hypertension Brother    CAD Brother 18       CABG   Colon cancer Neg Hx    Esophageal cancer Neg Hx    Pancreatic cancer Neg Hx    Rectal cancer Neg Hx    Stomach cancer Neg Hx    Liver cancer Neg Hx      Current Outpatient Medications (Cardiovascular):    amLODipine (NORVASC) 2.5 MG tablet, Take 1 tablet (2.5 mg total) by mouth daily.   ezetimibe (ZETIA) 10 MG tablet, TAKE 1 TABLET BY MOUTH EVERY DAY   rosuvastatin (CRESTOR) 40 MG tablet, TAKE 1 TABLET BY MOUTH EVERY DAY   Current Outpatient Medications (Analgesics):    aspirin EC 81 MG tablet, Take 1 tablet (81 mg total) by mouth  daily.   Current Outpatient Medications (Other):    azithromycin (ZITHROMAX) 250 MG tablet, 2 tab by mouth day 1 , then 1 per day   Coenzyme Q10 (CO Q-10 PO), Take 1 tablet by mouth daily.   cyclobenzaprine (FLEXERIL) 5 MG tablet, Take 1 tablet (5 mg total) by mouth 3 (three) times daily as needed for muscle spasms.   Diclofenac Sodium 2 % SOLN, Place 2 g onto the skin 2 (two) times daily.   LUTEIN-ZEAXANTHIN PO*, Take by mouth.   Multiple Vitamin (MULTIVITAMIN) tablet, Take 1 tablet by mouth daily.   OVER THE COUNTER MEDICATION, Tumeric 100 mg. One tablet daily. * These medications belong to multiple therapeutic classes and are listed under each applicable group.   Reviewed prior external information including notes and imaging from  primary care provider As well as notes that were available from care everywhere and other healthcare systems.  Past medical history, social, surgical and family history all reviewed in electronic medical record.  No pertanent information unless stated regarding to the chief complaint.   Review of Systems:  No headache, visual changes, nausea, vomiting, diarrhea, constipation, dizziness, abdominal pain, skin rash, fevers, chills, night sweats, weight loss, swollen lymph nodes, body aches, joint swelling, chest pain, shortness of breath, mood changes. POSITIVE muscle aches  Objective  Blood pressure 136/72, pulse 74, height 5\' 4"  (1.626 m), weight 220 lb (99.8 kg), SpO2 95 %.   General: No apparent distress alert and oriented x3 mood and affect normal, dressed appropriately.  HEENT: Pupils equal, extraocular movements intact  Respiratory: Patient's speak in full sentences and does not appear short of breath  Cardiovascular: No lower extremity edema, non tender, no erythema  Gait normal with good balance and coordination.  MSK: Bilateral shoulder exam show the patient does have positive impingement noted with Neer and Hawkins.  Patient does have good  strength of the rotator cuff noted.  Procedure: Real-time Ultrasound Guided Injection of right glenohumeral joint Device: GE Logiq Q7  Ultrasound guided injection is preferred based studies that show increased duration, increased effect, greater accuracy, decreased procedural pain, increased response rate with ultrasound guided versus blind injection.  Verbal informed consent obtained.  Time-out conducted.  Noted no overlying erythema, induration, or other signs of local infection.  Skin prepped in a sterile fashion.  Local anesthesia: Topical Ethyl chloride.  With sterile technique and under real time ultrasound guidance:  Joint visualized.  23g 1  inch needle inserted posterior approach. Pictures taken for needle placement. Patient did have injection of 2 cc of 1% lidocaine, 2 cc of 0.5% Marcaine, and 1.0 cc of Kenalog 40 mg/dL. Completed without difficulty  Advised to call  if fevers/chills, erythema, induration, drainage, or persistent bleeding.  Impression: Technically successful ultrasound guided injection.  Procedure: Real-time Ultrasound Guided Injection of left glenohumeral joint Device: GE Logiq E  Ultrasound guided injection is preferred based studies that show increased duration, increased effect, greater accuracy, decreased procedural pain, increased response rate with ultrasound guided versus blind injection.  Verbal informed consent obtained.  Time-out conducted.  Noted no overlying erythema, induration, or other signs of local infection.  Skin prepped in a sterile fashion.  Local anesthesia: Topical Ethyl chloride.  With sterile technique and under real time ultrasound guidance:  Joint visualized.  21g 2 inch needle inserted posterior approach. Pictures taken for needle placement. Patient did have injection of 2 cc of 0.5% Marcaine, and 1cc of Kenalog 40 mg/dL. Completed without difficulty  Pain immediately resolved suggesting accurate placement of the medication.  Advised  to call if fevers/chills, erythema, induration, drainage, or persistent bleeding.  Impression: Technically successful ultrasound guided injection.    Impression and Recommendations:     The above documentation has been reviewed and is accurate and complete Lyndal Pulley, DO

## 2021-02-03 ENCOUNTER — Ambulatory Visit: Payer: Self-pay

## 2021-02-03 ENCOUNTER — Other Ambulatory Visit: Payer: Self-pay

## 2021-02-03 ENCOUNTER — Encounter: Payer: Self-pay | Admitting: Family Medicine

## 2021-02-03 ENCOUNTER — Ambulatory Visit: Payer: Managed Care, Other (non HMO) | Admitting: Family Medicine

## 2021-02-03 VITALS — BP 136/72 | HR 74 | Ht 64.0 in | Wt 220.0 lb

## 2021-02-03 DIAGNOSIS — M7532 Calcific tendinitis of left shoulder: Secondary | ICD-10-CM

## 2021-02-03 DIAGNOSIS — M25512 Pain in left shoulder: Secondary | ICD-10-CM

## 2021-02-03 DIAGNOSIS — M7531 Calcific tendinitis of right shoulder: Secondary | ICD-10-CM

## 2021-02-03 NOTE — Patient Instructions (Signed)
Great to see you. Hopefully this helps and you can have fun when you travel Continue exercises See you again in 8 weeks

## 2021-02-03 NOTE — Assessment & Plan Note (Signed)
Bilateral injections given today, tolerated the procedure well, we will get x-rays of the left shoulder to further evaluate for any type of arthritic changes that could be contributing as well.  Patient did have improvement in range of motion almost immediately as well as decrease in pain.  Patient will follow up with me again in 4 to 8 weeks.

## 2021-02-15 ENCOUNTER — Other Ambulatory Visit (INDEPENDENT_AMBULATORY_CARE_PROVIDER_SITE_OTHER): Payer: Managed Care, Other (non HMO)

## 2021-02-15 ENCOUNTER — Encounter: Payer: 59 | Admitting: Internal Medicine

## 2021-02-15 DIAGNOSIS — Z0001 Encounter for general adult medical examination with abnormal findings: Secondary | ICD-10-CM

## 2021-02-15 DIAGNOSIS — R7302 Impaired glucose tolerance (oral): Secondary | ICD-10-CM | POA: Diagnosis not present

## 2021-02-15 LAB — HEPATIC FUNCTION PANEL
ALT: 40 U/L (ref 0–53)
AST: 25 U/L (ref 0–37)
Albumin: 4.8 g/dL (ref 3.5–5.2)
Alkaline Phosphatase: 51 U/L (ref 39–117)
Bilirubin, Direct: 0.2 mg/dL (ref 0.0–0.3)
Total Bilirubin: 0.8 mg/dL (ref 0.2–1.2)
Total Protein: 6.8 g/dL (ref 6.0–8.3)

## 2021-02-15 LAB — BASIC METABOLIC PANEL
BUN: 18 mg/dL (ref 6–23)
CO2: 28 mEq/L (ref 19–32)
Calcium: 9.6 mg/dL (ref 8.4–10.5)
Chloride: 99 mEq/L (ref 96–112)
Creatinine, Ser: 1.07 mg/dL (ref 0.40–1.50)
GFR: 76.46 mL/min (ref >60.00)
Glucose, Bld: 97 mg/dL (ref 70–99)
Potassium: 4.9 mEq/L (ref 3.5–5.1)
Sodium: 136 mEq/L (ref 135–145)

## 2021-02-15 LAB — CBC WITH DIFFERENTIAL/PLATELET
Basophils Absolute: 0 10*3/uL (ref 0.0–0.1)
Basophils Relative: 0.6 % (ref 0.0–3.0)
Eosinophils Absolute: 0.1 10*3/uL (ref 0.0–0.7)
Eosinophils Relative: 1.4 % (ref 0.0–5.0)
HCT: 48.2 % (ref 39.0–52.0)
Hemoglobin: 16 g/dL (ref 13.0–17.0)
Lymphocytes Relative: 27.8 % (ref 12.0–46.0)
Lymphs Abs: 2.1 10*3/uL (ref 0.7–4.0)
MCHC: 33.2 g/dL (ref 30.0–36.0)
MCV: 92.9 fl (ref 78.0–100.0)
Monocytes Absolute: 0.6 10*3/uL (ref 0.1–1.0)
Monocytes Relative: 8.1 % (ref 3.0–12.0)
Neutro Abs: 4.8 10*3/uL (ref 1.4–7.7)
Neutrophils Relative %: 62.1 % (ref 43.0–77.0)
Platelets: 260 10*3/uL (ref 150.0–400.0)
RBC: 5.19 Mil/uL (ref 4.22–5.81)
RDW: 13.9 % (ref 11.5–15.5)
WBC: 7.7 10*3/uL (ref 4.0–10.5)

## 2021-02-15 LAB — LIPID PANEL
Cholesterol: 113 mg/dL (ref 0–200)
HDL: 55.9 mg/dL (ref >39.00)
LDL Cholesterol: 37 mg/dL (ref 0–99)
NonHDL: 57.16
Total CHOL/HDL Ratio: 2
Triglycerides: 100 mg/dL (ref 0.0–149.0)
VLDL: 20 mg/dL (ref 0.0–40.0)

## 2021-02-15 LAB — URINALYSIS, ROUTINE W REFLEX MICROSCOPIC
Bilirubin Urine: NEGATIVE
Hgb urine dipstick: NEGATIVE
Ketones, ur: NEGATIVE
Leukocytes,Ua: NEGATIVE
Nitrite: NEGATIVE
RBC / HPF: NONE SEEN (ref 0–?)
Specific Gravity, Urine: 1.02 (ref 1.000–1.030)
Total Protein, Urine: NEGATIVE
Urine Glucose: NEGATIVE
Urobilinogen, UA: 0.2 (ref 0.0–1.0)
pH: 6 (ref 5.0–8.0)

## 2021-02-15 LAB — PSA: PSA: 0.59 ng/mL (ref 0.10–4.00)

## 2021-02-15 LAB — TSH: TSH: 3.75 u[IU]/mL (ref 0.35–5.50)

## 2021-02-15 LAB — HEMOGLOBIN A1C: Hgb A1c MFr Bld: 6.2 % (ref 4.6–6.5)

## 2021-02-24 ENCOUNTER — Encounter: Payer: Self-pay | Admitting: Internal Medicine

## 2021-02-24 ENCOUNTER — Other Ambulatory Visit: Payer: Self-pay

## 2021-02-24 ENCOUNTER — Ambulatory Visit (INDEPENDENT_AMBULATORY_CARE_PROVIDER_SITE_OTHER): Payer: Managed Care, Other (non HMO) | Admitting: Internal Medicine

## 2021-02-24 VITALS — BP 122/60 | HR 80 | Resp 18 | Ht 64.0 in | Wt 215.2 lb

## 2021-02-24 DIAGNOSIS — E7800 Pure hypercholesterolemia, unspecified: Secondary | ICD-10-CM | POA: Diagnosis not present

## 2021-02-24 DIAGNOSIS — I10 Essential (primary) hypertension: Secondary | ICD-10-CM | POA: Diagnosis not present

## 2021-02-24 DIAGNOSIS — Z0000 Encounter for general adult medical examination without abnormal findings: Secondary | ICD-10-CM

## 2021-02-24 DIAGNOSIS — R7302 Impaired glucose tolerance (oral): Secondary | ICD-10-CM | POA: Diagnosis not present

## 2021-02-24 MED ORDER — EZETIMIBE 10 MG PO TABS: 10.0000 mg | ORAL_TABLET | Freq: Every day | ORAL | 3 refills | Status: DC

## 2021-02-24 MED ORDER — CYCLOBENZAPRINE HCL 5 MG PO TABS: 5.0000 mg | ORAL_TABLET | Freq: Three times a day (TID) | ORAL | 3 refills | Status: DC | PRN

## 2021-02-24 MED ORDER — ROSUVASTATIN CALCIUM 40 MG PO TABS: 40.0000 mg | ORAL_TABLET | Freq: Every day | ORAL | 3 refills | Status: DC

## 2021-02-24 MED ORDER — AMLODIPINE BESYLATE 2.5 MG PO TABS: 2.5000 mg | ORAL_TABLET | Freq: Every day | ORAL | 3 refills | Status: DC

## 2021-02-24 NOTE — Assessment & Plan Note (Signed)
BP Readings from Last 3 Encounters:  02/24/21 122/60  02/03/21 136/72  01/28/21 121/65   Stable, pt to continue medical treatment amlodipine

## 2021-02-24 NOTE — Assessment & Plan Note (Signed)
Age and sex appropriate education and counseling updated with regular exercise and diet Referrals for preventative services - none needed Immunizations addressed - declines covid booster, pnemovax Smoking counseling  - none needed Evidence for depression or other mood disorder - none significant Most recent labs reviewed. I have personally reviewed and have noted: 1) the patient's medical and social history 2) The patient's current medications and supplements 3) The patient's height, weight, and BMI have been recorded in the chart

## 2021-02-24 NOTE — Patient Instructions (Signed)
Please continue all other medications as before, and refills have been done if requested.  Please have the pharmacy call with any other refills you may need.  Please continue your efforts at being more active, low cholesterol diet, and weight control.  You are otherwise up to date with prevention measures today.  Please keep your appointments with your specialists as you may have planned  Please make an Appointment to return for your 1 year visit, or sooner if needed, with Lab testing by Appointment as well, to be done about 3-5 days before at the [facility] (so this is for TWO appointments - please see the scheduling desk as you leave)   Due to the ongoing Covid 19 pandemic, our lab now requires an appointment for any labs done at our office.  If you need labs done and do not have an appointment, please call our office ahead of time to schedule before presenting to the lab for your testing.

## 2021-02-24 NOTE — Assessment & Plan Note (Signed)
Lab Results  Component Value Date   LDLCALC 37 02/15/2021   Stable, pt to continue current statin crestor and zetia

## 2021-02-24 NOTE — Progress Notes (Signed)
Patient ID: AMADI FRADY, male   DOB: Mar 14, 1961, 60 y.o.   MRN: [id number]         Chief Complaint:: wellness exam        HPI:  Brett Griffith is a 60 y.o. male here for wellness exam, declines covid booster, pneumovax, o/w up to date         ;                Also o/w doing well. Pt denies chest pain, increased sob or doe, wheezing, orthopnea, PND, increased LE swelling, palpitations, dizziness or syncope.   Pt denies polydipsia, polyuria, goes to gym several times per wk.   Pt denies fever, wt loss, night sweats, loss of appetite, or other constitutional symptoms  No other new complaints    Wt Readings from Last 3 Encounters:  02/24/21 215 lb 3.2 oz (97.6 kg)  02/03/21 220 lb (99.8 kg)  01/28/21 215 lb 12.8 oz (97.9 kg)   BP Readings from Last 3 Encounters:  02/24/21 122/60  02/03/21 136/72  01/28/21 121/65   Immunization History  Administered Date(s) Administered   DTaP 06/28/2010   Influenza Split 12/26/2011   Influenza,inj,Quad PF,6+ Mos 12/05/2013, 02/25/2015, 02/04/2016   Influenza-Unspecified 01/25/2017, 12/26/2017, 12/11/2018, 01/20/2020, 02/12/2021   PFIZER(Purple Top)SARS-COV-2 Vaccination 05/20/2019, 06/24/2019, 01/06/2020   Td 08/27/2007   Tdap 02/09/2018   Zoster Recombinat (Shingrix) 03/23/2019, 05/25/2019   There are no preventive care reminders to display for this patient.     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   moderate-severe right post neck   Atypical nevus 02/26/2018   left post sholder superior--moderate   History of kidney stones    HYPERLIPIDEMIA 01/08/2009   Impaired glucose tolerance 09/21/2010   NEPHROLITHIASIS, HX OF 01/08/2009   Shingles    TIA (transient ischemic attack)    Past Surgical History:  Procedure Laterality Date   INSERTION OF MESH N/A 08/10/2016   Procedure: INSERTION OF MESH;  Surgeon: Clovis Riley, MD;  Location: [facility];  Service: General;  Laterality: N/A;    s/p ganglion cyst     right wrist   s/p pilonidal cystectomy     UMBILICAL HERNIA REPAIR N/A 08/10/2016   Procedure: UMBILICAL HERNIA REPAIR;  Surgeon: Clovis Riley, MD;  Location: [facility];  Service: General;  Laterality: N/A;   WISDOM TOOTH EXTRACTION      reports that he has never smoked. He has never used smokeless tobacco. He reports current alcohol use of about 2.0 standard drinks per week. He reports that he does not use drugs. family history includes CAD (age of onset: 22) in his brother; CAD (age of onset: 11) in his father; Hypertension in his brother, father, and mother; Peripheral vascular disease (age of onset: 30) in his mother. Allergies  Allergen Reactions   Ezetimibe-Simvastatin     REACTION: more emotional   Current Outpatient Medications on File Prior to Visit  Medication Sig Dispense Refill   aspirin EC 81 MG tablet Take 1 tablet (81 mg total) by mouth daily. 90 tablet 11   azithromycin (ZITHROMAX) 250 MG tablet 2 tab by mouth day 1 , then 1 per day 6 tablet 0   Coenzyme Q10 (CO Q-10 PO) Take 1 tablet by mouth daily.     Diclofenac Sodium 2 % SOLN Place 2 g onto the skin 2 (two) times daily. 112 g 3   LUTEIN-ZEAXANTHIN PO  Take by mouth.     Multiple Vitamin (MULTIVITAMIN) tablet Take 1 tablet by mouth daily.     OVER THE COUNTER MEDICATION Tumeric 100 mg. One tablet daily.     No current facility-administered medications on file prior to visit.        ROS:  All others reviewed and negative.  Objective        PE:  BP 122/60   Pulse 80   Resp 18   Ht 5\' 4"  (1.626 m)   Wt 215 lb 3.2 oz (97.6 kg)   SpO2 95%   BMI 36.94 kg/m                 Constitutional: Pt appears in NAD               HENT: Head: NCAT.                Right Ear: External ear normal.                 Left Ear: External ear normal.                Eyes: . Pupils are equal, round, and reactive to light. Conjunctivae and EOM are normal               Nose: without d/c or  deformity               Neck: Neck supple. Gross normal ROM               Cardiovascular: Normal rate and regular rhythm.                 Pulmonary/Chest: Effort normal and breath sounds without rales or wheezing.                Abd:  Soft, NT, ND, + BS, no organomegaly               Neurological: Pt is alert. At baseline orientation, motor grossly intact               Skin: Skin is warm. No rashes, no other new lesions, LE edema - none               Psychiatric: Pt behavior is normal without agitation   Micro: none  Cardiac tracings I have personally interpreted today:  none  Pertinent Radiological findings (summarize): none   Lab Results  Component Value Date   WBC 7.7 02/15/2021   HGB 16.0 02/15/2021   HCT 48.2 02/15/2021   PLT 260.0 02/15/2021   GLUCOSE 97 02/15/2021   CHOL 113 02/15/2021   TRIG 100.0 02/15/2021   HDL 55.90 02/15/2021   LDLDIRECT 51 12/30/2019   LDLCALC 37 02/15/2021   ALT 40 02/15/2021   AST 25 02/15/2021   NA 136 02/15/2021   K 4.9 02/15/2021   CL 99 02/15/2021   CREATININE 1.07 02/15/2021   BUN 18 02/15/2021   CO2 28 02/15/2021   TSH 3.75 02/15/2021   PSA 0.59 02/15/2021   INR 0.97 10/07/2009   HGBA1C 6.2 02/15/2021   Assessment/Plan:  ROMAN DUBUC is a 60 y.o. White or Caucasian [1] male with  has a past medical history of Allergy, Atypical nevus (02/23/2017), Atypical nevus (02/26/2018), History of kidney stones, HYPERLIPIDEMIA (01/08/2009), Impaired glucose tolerance (09/21/2010), NEPHROLITHIASIS, HX OF (01/08/2009), Shingles, and TIA (transient ischemic attack).  Impaired glucose tolerance Lab Results  Component Value Date   HGBA1C 6.2 02/15/2021  Stable, pt to continue current medical treatment - diet   Essential hypertension BP Readings from Last 3 Encounters:  02/24/21 122/60  02/03/21 136/72  01/28/21 121/65   Stable, pt to continue medical treatment amlodipine   Hyperlipidemia Lab Results  Component Value Date   LDLCALC  37 02/15/2021   Stable, pt to continue current statin crestor and zetia    Preventative health care Age and sex appropriate education and counseling updated with regular exercise and diet Referrals for preventative services - none needed Immunizations addressed - declines covid booster, pnemovax Smoking counseling  - none needed Evidence for depression or other mood disorder - none significant Most recent labs reviewed. I have personally reviewed and have noted: 1) the patient's medical and social history 2) The patient's current medications and supplements 3) The patient's height, weight, and BMI have been recorded in the chart  Followup: Return in about 1 year (around 02/24/2022).  Cathlean Cower, MD 02/24/2021 9:28 PM [facility]

## 2021-02-24 NOTE — Assessment & Plan Note (Signed)
Lab Results  Component Value Date   HGBA1C 6.2 02/15/2021   Stable, pt to continue current medical treatment - diet

## 2021-03-25 ENCOUNTER — Ambulatory Visit: Payer: Managed Care, Other (non HMO) | Admitting: Family Medicine

## 2022-02-25 ENCOUNTER — Other Ambulatory Visit: Payer: No Typology Code available for payment source

## 2022-02-25 ENCOUNTER — Other Ambulatory Visit: Payer: Self-pay | Admitting: Internal Medicine

## 2022-02-25 DIAGNOSIS — E538 Deficiency of other specified B group vitamins: Secondary | ICD-10-CM

## 2022-02-25 DIAGNOSIS — I10 Essential (primary) hypertension: Secondary | ICD-10-CM

## 2022-02-25 DIAGNOSIS — R7302 Impaired glucose tolerance (oral): Secondary | ICD-10-CM

## 2022-02-25 DIAGNOSIS — E559 Vitamin D deficiency, unspecified: Secondary | ICD-10-CM

## 2022-02-25 DIAGNOSIS — Z125 Encounter for screening for malignant neoplasm of prostate: Secondary | ICD-10-CM

## 2022-02-28 ENCOUNTER — Encounter: Payer: No Typology Code available for payment source | Admitting: Internal Medicine

## 2022-03-01 ENCOUNTER — Ambulatory Visit (INDEPENDENT_AMBULATORY_CARE_PROVIDER_SITE_OTHER): Payer: No Typology Code available for payment source | Admitting: Internal Medicine

## 2022-03-01 ENCOUNTER — Other Ambulatory Visit (INDEPENDENT_AMBULATORY_CARE_PROVIDER_SITE_OTHER): Payer: No Typology Code available for payment source

## 2022-03-01 VITALS — BP 126/68 | HR 72 | Temp 98.1°F | Ht 64.0 in | Wt 219.0 lb

## 2022-03-01 DIAGNOSIS — Z125 Encounter for screening for malignant neoplasm of prostate: Secondary | ICD-10-CM

## 2022-03-01 DIAGNOSIS — Z0001 Encounter for general adult medical examination with abnormal findings: Secondary | ICD-10-CM

## 2022-03-01 DIAGNOSIS — E559 Vitamin D deficiency, unspecified: Secondary | ICD-10-CM | POA: Diagnosis not present

## 2022-03-01 DIAGNOSIS — I10 Essential (primary) hypertension: Secondary | ICD-10-CM

## 2022-03-01 DIAGNOSIS — R7302 Impaired glucose tolerance (oral): Secondary | ICD-10-CM

## 2022-03-01 DIAGNOSIS — E538 Deficiency of other specified B group vitamins: Secondary | ICD-10-CM | POA: Diagnosis not present

## 2022-03-01 DIAGNOSIS — E7800 Pure hypercholesterolemia, unspecified: Secondary | ICD-10-CM

## 2022-03-01 LAB — URINALYSIS, ROUTINE W REFLEX MICROSCOPIC
Bilirubin Urine: NEGATIVE
Hgb urine dipstick: NEGATIVE
Ketones, ur: NEGATIVE
Leukocytes,Ua: NEGATIVE
Nitrite: NEGATIVE
RBC / HPF: NONE SEEN (ref 0–?)
Specific Gravity, Urine: 1.02 (ref 1.000–1.030)
Total Protein, Urine: NEGATIVE
Urine Glucose: NEGATIVE
Urobilinogen, UA: 0.2 (ref 0.0–1.0)
pH: 6 (ref 5.0–8.0)

## 2022-03-01 LAB — BASIC METABOLIC PANEL
BUN: 22 mg/dL (ref 6–23)
CO2: 27 mEq/L (ref 19–32)
Calcium: 9.4 mg/dL (ref 8.4–10.5)
Chloride: 103 mEq/L (ref 96–112)
Creatinine, Ser: 0.95 mg/dL (ref 0.40–1.50)
GFR: 87.56 mL/min (ref >60.00)
Glucose, Bld: 111 mg/dL — ABNORMAL HIGH (ref 70–99)
Potassium: 4.7 mEq/L (ref 3.5–5.1)
Sodium: 140 mEq/L (ref 135–145)

## 2022-03-01 LAB — HEPATIC FUNCTION PANEL
ALT: 29 U/L (ref 0–53)
AST: 18 U/L (ref 0–37)
Albumin: 4.8 g/dL (ref 3.5–5.2)
Alkaline Phosphatase: 55 U/L (ref 39–117)
Bilirubin, Direct: 0.2 mg/dL (ref 0.0–0.3)
Total Bilirubin: 0.8 mg/dL (ref 0.2–1.2)
Total Protein: 7.2 g/dL (ref 6.0–8.3)

## 2022-03-01 LAB — CBC WITH DIFFERENTIAL/PLATELET
Basophils Absolute: 0 10*3/uL (ref 0.0–0.1)
Basophils Relative: 0.4 % (ref 0.0–3.0)
Eosinophils Absolute: 0.2 10*3/uL (ref 0.0–0.7)
Eosinophils Relative: 3.7 % (ref 0.0–5.0)
HCT: 46.2 % (ref 39.0–52.0)
Hemoglobin: 15.8 g/dL (ref 13.0–17.0)
Lymphocytes Relative: 32.2 % (ref 12.0–46.0)
Lymphs Abs: 1.9 10*3/uL (ref 0.7–4.0)
MCHC: 34.2 g/dL (ref 30.0–36.0)
MCV: 90.8 fl (ref 78.0–100.0)
Monocytes Absolute: 0.7 10*3/uL (ref 0.1–1.0)
Monocytes Relative: 11.6 % (ref 3.0–12.0)
Neutro Abs: 3 10*3/uL (ref 1.4–7.7)
Neutrophils Relative %: 52.1 % (ref 43.0–77.0)
Platelets: 259 10*3/uL (ref 150.0–400.0)
RBC: 5.09 Mil/uL (ref 4.22–5.81)
RDW: 13.4 % (ref 11.5–15.5)
WBC: 5.8 10*3/uL (ref 4.0–10.5)

## 2022-03-01 LAB — HEMOGLOBIN A1C: Hgb A1c MFr Bld: 6.4 % (ref 4.6–6.5)

## 2022-03-01 LAB — LIPID PANEL
Cholesterol: 110 mg/dL (ref 0–200)
HDL: 55.6 mg/dL (ref >39.00)
LDL Cholesterol: 38 mg/dL (ref 0–99)
NonHDL: 53.95
Total CHOL/HDL Ratio: 2
Triglycerides: 81 mg/dL (ref 0.0–149.0)
VLDL: 16.2 mg/dL (ref 0.0–40.0)

## 2022-03-01 LAB — PSA: PSA: 0.68 ng/mL (ref 0.10–4.00)

## 2022-03-01 LAB — TSH: TSH: 1.64 u[IU]/mL (ref 0.35–5.50)

## 2022-03-01 LAB — VITAMIN B12: Vitamin B-12: 331 pg/mL (ref 211–911)

## 2022-03-01 LAB — VITAMIN D 25 HYDROXY (VIT D DEFICIENCY, FRACTURES): VITD: 46.14 ng/mL (ref 30.00–100.00)

## 2022-03-01 MED ORDER — ROSUVASTATIN CALCIUM 40 MG PO TABS: 40.0000 mg | ORAL_TABLET | Freq: Every day | ORAL | 3 refills | Status: DC

## 2022-03-01 MED ORDER — EZETIMIBE 10 MG PO TABS: 10.0000 mg | ORAL_TABLET | Freq: Every day | ORAL | 3 refills | Status: DC

## 2022-03-01 MED ORDER — AMLODIPINE BESYLATE 2.5 MG PO TABS: 2.5000 mg | ORAL_TABLET | Freq: Every day | ORAL | 3 refills | Status: DC

## 2022-03-01 NOTE — Patient Instructions (Signed)
Please continue all other medications as before, and refills have been done if requested.  Please have the pharmacy call with any other refills you may need.  Please continue your efforts at being more active, low cholesterol diet, and weight control.  You are otherwise up to date with prevention measures today.  Please keep your appointments with your specialists as you may have planned - Dr [facility] soon  Your lab testing was done this AM  You will be contacted by phone if any changes need to be made immediately.  Otherwise, you will receive a letter about your results with an explanation, but please check with MyChart first.  Please remember to sign up for MyChart if you have not done so, as this will be important to you in the future with finding out test results, communicating by private email, and scheduling acute appointments online when needed.  Please make an Appointment to return for your 1 year visit, or sooner if needed, with Lab testing by Appointment as well, to be done about 3-5 days before at the [facility] (so this is for TWO appointments - please see the scheduling desk as you leave)

## 2022-03-01 NOTE — Progress Notes (Unsigned)
Patient ID: Brett Griffith, male   DOB: [birth year], 61 y.o.   MRN: [id number]         Chief Complaint:: wellness exam and Physical (No concerns or questions)  ***       HPI:  Brett Griffith is a 61 y.o. male here for wellness exam                        Also***   Wt Readings from Last 3 Encounters:  03/01/22 219 lb (99.3 kg)  02/24/21 215 lb 3.2 oz (97.6 kg)  02/03/21 220 lb (99.8 kg)   BP Readings from Last 3 Encounters:  03/01/22 126/68  02/24/21 122/60  02/03/21 136/72   Immunization History  Administered Date(s) Administered   DTaP 06/28/2010   Influenza Inj Mdck Quad Pf 01/27/2022   Influenza Split 12/26/2011   Influenza,inj,Quad PF,6+ Mos 12/05/2013, 02/25/2015, 02/04/2016   Influenza-Unspecified 01/25/2017, 12/26/2017, 12/11/2018, 01/20/2020, 02/12/2021   PFIZER(Purple Top)SARS-COV-2 Vaccination 05/20/2019, 06/24/2019, 01/06/2020   Td 08/27/2007   Tdap 02/09/2018   Zoster Recombinat (Shingrix) 03/23/2019, 05/25/2019  There are no preventive care reminders to display for this patient.    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   moderate-severe right post neck   Atypical nevus 02/26/2018   left post sholder superior--moderate   History of kidney stones    HYPERLIPIDEMIA 01/08/2009   Impaired glucose tolerance 09/21/2010   NEPHROLITHIASIS, HX OF 01/08/2009   Shingles    TIA (transient ischemic attack)    Past Surgical History:  Procedure Laterality Date   INSERTION OF MESH N/A 08/10/2016   Procedure: INSERTION OF MESH;  Surgeon: Clovis Riley, MD;  Location: [facility];  Service: General;  Laterality: N/A;   s/p ganglion cyst     right wrist   s/p pilonidal cystectomy     UMBILICAL HERNIA REPAIR N/A 08/10/2016   Procedure: UMBILICAL HERNIA REPAIR;  Surgeon: Clovis Riley, MD;  Location: [place];  Service: General;  Laterality: N/A;   WISDOM TOOTH EXTRACTION      reports that he  has never smoked. He has never used smokeless tobacco. He reports current alcohol use of about 2.0 standard drinks of alcohol per week. He reports that he does not use drugs. family history includes CAD (age of onset: 81) in his brother; CAD (age of onset: 57) in his father; Hypertension in his brother, father, and mother; Peripheral vascular disease (age of onset: 16) in his mother. Allergies  Allergen Reactions   Ezetimibe-Simvastatin     REACTION: more emotional   Current Outpatient Medications on File Prior to Visit  Medication Sig Dispense Refill   aspirin EC 81 MG tablet Take 1 tablet (81 mg total) by mouth daily. 90 tablet 11   azithromycin (ZITHROMAX) 250 MG tablet 2 tab by mouth day 1 , then 1 per day 6 tablet 0   Coenzyme Q10 (CO Q-10 PO) Take 1 tablet by mouth daily.     cyclobenzaprine (FLEXERIL) 5 MG tablet Take 1 tablet (5 mg total) by mouth 3 (three) times daily as needed for muscle spasms. 90 tablet 3   Diclofenac Sodium 2 % SOLN Place 2 g onto the skin 2 (two) times daily. 112 g 3   ezetimibe (ZETIA) 10 MG tablet Take 1 tablet (10 mg total) by mouth daily. 90 tablet 3   LUTEIN-ZEAXANTHIN PO Take by mouth.  Multiple Vitamin (MULTIVITAMIN) tablet Take 1 tablet by mouth daily.     OVER THE COUNTER MEDICATION Tumeric 100 mg. One tablet daily.     rosuvastatin (CRESTOR) 40 MG tablet Take 1 tablet (40 mg total) by mouth daily. 90 tablet 3   amLODipine (NORVASC) 2.5 MG tablet Take 1 tablet (2.5 mg total) by mouth daily. 180 tablet 3   No current facility-administered medications on file prior to visit.        ROS:  All others reviewed and negative.  Objective        PE:  BP 126/68 (BP Location: Left Arm, Patient Position: Sitting, Cuff Size: Large)   Pulse 72   Temp 98.1 F (36.7 C) (Oral)   Ht '5\' 4"'$  (1.626 m)   Wt 219 lb (99.3 kg)   SpO2 93%   BMI 37.59 kg/m                 Constitutional: Pt appears in NAD               HENT: Head: NCAT.                Right Ear:  External ear normal.                 Left Ear: External ear normal.                Eyes: . Pupils are equal, round, and reactive to light. Conjunctivae and EOM are normal               Nose: without d/c or deformity               Neck: Neck supple. Gross normal ROM               Cardiovascular: Normal rate and regular rhythm.                 Pulmonary/Chest: Effort normal and breath sounds without rales or wheezing.                Abd:  Soft, NT, ND, + BS, no organomegaly               Neurological: Pt is alert. At baseline orientation, motor grossly intact               Skin: Skin is warm. No rashes, no other new lesions, LE edema - ***               Psychiatric: Pt behavior is normal without agitation   Micro: none  Cardiac tracings I have personally interpreted today:  none  Pertinent Radiological findings (summarize): none   Lab Results  Component Value Date   WBC 7.7 02/15/2021   HGB 16.0 02/15/2021   HCT 48.2 02/15/2021   PLT 260.0 02/15/2021   GLUCOSE 97 02/15/2021   CHOL 113 02/15/2021   TRIG 100.0 02/15/2021   HDL 55.90 02/15/2021   LDLDIRECT 51 12/30/2019   LDLCALC 37 02/15/2021   ALT 40 02/15/2021   AST 25 02/15/2021   NA 136 02/15/2021   K 4.9 02/15/2021   CL 99 02/15/2021   CREATININE 1.07 02/15/2021   BUN 18 02/15/2021   CO2 28 02/15/2021   TSH 3.75 02/15/2021   PSA 0.59 02/15/2021   INR 0.97 10/07/2009   HGBA1C 6.2 02/15/2021   Assessment/Plan:  Brett Griffith is a 62 y.o. White or Caucasian [1] male with  has a past medical history  of Allergy, Atypical nevus (02/23/2017), Atypical nevus (02/26/2018), History of kidney stones, HYPERLIPIDEMIA (01/08/2009), Impaired glucose tolerance (09/21/2010), NEPHROLITHIASIS, HX OF (01/08/2009), Shingles, and TIA (transient ischemic attack).  No problem-specific Assessment & Plan notes found for this encounter.  Followup: No follow-ups on file.  Cathlean Cower, MD 03/01/2022 2:57 PM [facility]

## 2022-03-03 DIAGNOSIS — E538 Deficiency of other specified B group vitamins: Secondary | ICD-10-CM | POA: Insufficient documentation

## 2022-03-03 DIAGNOSIS — Z0001 Encounter for general adult medical examination with abnormal findings: Secondary | ICD-10-CM | POA: Insufficient documentation

## 2022-03-03 NOTE — Assessment & Plan Note (Signed)
Lab Results  Component Value Date   HGBA1C 6.4 03/01/2022   Stable, pt to continue current medical treatment  -diet, wt control, excercise

## 2022-03-03 NOTE — Assessment & Plan Note (Signed)

## 2022-03-03 NOTE — Assessment & Plan Note (Signed)
Mild to mod, otc b12 1000 mcg qd for 6 mo,  to f/u any worsening symptoms or concerns

## 2022-03-03 NOTE — Assessment & Plan Note (Signed)
Lab Results  Component Value Date   LDLCALC 38 03/01/2022   Stable, pt to continue current statin crestor 40  mg qd, zetia 10 mg qd

## 2022-03-03 NOTE — Assessment & Plan Note (Signed)
BP Readings from Last 3 Encounters:  03/01/22 126/68  02/24/21 122/60  02/03/21 136/72   Stable, pt to continue medical treatment norvasc 2.5 mg qd   Current Outpatient Medications (Cardiovascular):    amLODipine (NORVASC) 2.5 MG tablet, Take 1 tablet (2.5 mg total) by mouth daily.   ezetimibe (ZETIA) 10 MG tablet, Take 1 tablet (10 mg total) by mouth daily.   rosuvastatin (CRESTOR) 40 MG tablet, Take 1 tablet (40 mg total) by mouth daily.   Current Outpatient Medications (Analgesics):    aspirin EC 81 MG tablet, Take 1 tablet (81 mg total) by mouth daily.   Current Outpatient Medications (Other):    Coenzyme Q10 (CO Q-10 PO), Take 1 tablet by mouth daily.   cyclobenzaprine (FLEXERIL) 5 MG tablet, Take 1 tablet (5 mg total) by mouth 3 (three) times daily as needed for muscle spasms.   Diclofenac Sodium 2 % SOLN, Place 2 g onto the skin 2 (two) times daily.   LUTEIN-ZEAXANTHIN PO*, Take by mouth.   Multiple Vitamin (MULTIVITAMIN) tablet, Take 1 tablet by mouth daily.   OVER THE COUNTER MEDICATION, Tumeric 100 mg. One tablet daily. * These medications belong to multiple therapeutic classes and are listed under each applicable group.

## 2022-05-04 ENCOUNTER — Ambulatory Visit: Payer: No Typology Code available for payment source | Admitting: Cardiology

## 2022-06-22 ENCOUNTER — Telehealth: Payer: Self-pay | Admitting: Cardiology

## 2022-06-22 NOTE — Telephone Encounter (Signed)
Patient had labs with Dr. Jenny Reichmann, PCP, in December, 2023. He states normally Dr. Percival Spanish reviews his labs from Dr. Jenny Reichmann when he sees him no more than 2 months later. However, he would like to know if he will need additional labs this time since his follow up with Dr. Percival Spanish is scheduled for 4/05, which is 4 months after having had labs with Dr. Jenny Reichmann. Please advise.

## 2022-06-22 NOTE — Telephone Encounter (Signed)
Returned call to patient-advised labs in epic.  Will order additional labs day of appt if needed.  Patient aware.

## 2022-06-30 NOTE — Progress Notes (Signed)
  Cardiology Office Note:   Date:  07/01/2022  ID:  Brett Griffith, DOB May 01, 1960, MRN [id number]  History of Present Illness:   Brett Griffith is a 62 y.o. male who presents for evaluation of elevated coronary calcium.   I saw him in  2016 for evaluation of dyspnea.  He had extensive coronary calcium.  However, he had a negative stress echo in Jan of 2017 and a negative POET (Plain Old Exercise Treadmill) in 2018.    He went to [place] recently.  He got he got off of his exercise regimen.  He has been going to the gym every morning.  He still physically active.  With his activity level which includes a lot of walking and carrying grandchildren he is not having any cardiovascular symptoms. The patient denies any new symptoms such as chest discomfort, neck or arm discomfort. There has been no new shortness of breath, PND or orthopnea. There have been no reported palpitations, presyncope or syncope.    ROS: As stated in the HPI and negative for all other systems.  Studies Reviewed:    EKG: Sinus rhythm, rate 68, axis within normal limits, intervals within normal limits, no acute ST-T wave changes.  Risk Assessment/Calculations:              Physical Exam:   VS:  BP 132/76   Pulse 68   Ht  (1.702 m)   Wt 220 lb (99.8 kg)   SpO2 96%   BMI 34.46 kg/m    Wt Readings from Last 3 Encounters:  07/01/22 220 lb (99.8 kg)  03/01/22 219 lb (99.3 kg)  02/24/21 215 lb 3.2 oz (97.6 kg)     GEN: Well nourished, well developed in no acute distress NECK: No JVD; No carotid bruits CARDIAC: RRR, no murmurs, rubs, gallops RESPIRATORY:  Clear to auscultation without rales, wheezing or rhonchi  ABDOMEN: Soft, non-tender, non-distended EXTREMITIES:  No edema; No deformity   ASSESSMENT AND PLAN:   CORONARY CALICIUM:   He had a negative POET (Plain Old Exercise Treadmill) with excellent exercise tolerance in 2022.  He has no new symptoms.  We will continue with aggressive risk reduction.  Currently  further testing is indicated but he would let me know when he gets back to exercise if he has any symptoms.  We do have a low threshold for testing in the future.  DM: His A1c is 6.5.  I have suggested GLP-1 ra.  We talked a lot about this.  He agrees to start this and understands the benefits from cardiovascular standpoint, weight loss and diabetes management.  I will refer him to our Pharm D Clinic.    DYSLIPIDEMIA:    His LDL was 38 with an HDL of 82.9.  He will continue on the meds as listed and I will check an LP(a).   HTN:   My goal for him is 120s/70s.  This is what he is at home.  He will continue the meds as listed.   OVERWEIGHT: He understands need for some weight loss and we talked about this.        Signed, Rollene Rotunda, MD

## 2022-07-01 ENCOUNTER — Ambulatory Visit: Payer: No Typology Code available for payment source | Attending: Cardiology | Admitting: Cardiology

## 2022-07-01 ENCOUNTER — Encounter: Payer: Self-pay | Admitting: Cardiology

## 2022-07-01 VITALS — BP 132/76 | HR 68 | Ht 67.0 in | Wt 220.0 lb

## 2022-07-01 DIAGNOSIS — E785 Hyperlipidemia, unspecified: Secondary | ICD-10-CM

## 2022-07-01 DIAGNOSIS — I10 Essential (primary) hypertension: Secondary | ICD-10-CM

## 2022-07-01 DIAGNOSIS — R931 Abnormal findings on diagnostic imaging of heart and coronary circulation: Secondary | ICD-10-CM

## 2022-07-01 NOTE — Patient Instructions (Signed)
Medication Instructions:  Your physician recommends that you continue on your current medications as directed. Please refer to the Current Medication list given to you today.  *If you need a refill on your cardiac medications before your next appointment, please call your pharmacy*   Lab Work: Your physician recommends that you have labs drawn today: LPa  If you have labs (blood work) drawn today and your tests are completely normal, you will receive your results only by: MyChart Message (if you have MyChart) OR A paper copy in the mail If you have any lab test that is abnormal or we need to change your treatment, we will call you to review the results.   Follow-Up: At [facility], you and your health needs are our priority.  As part of our continuing mission to provide you with exceptional heart care, we have created designated Provider Care Teams.  These Care Teams include your primary Cardiologist (physician) and Advanced Practice Providers (APPs -  Physician Assistants and Nurse Practitioners) who all work together to provide you with the care you need, when you need it.  We recommend signing up for the patient portal called "MyChart".  Sign up information is provided on this After Visit Summary.  MyChart is used to connect with patients for Virtual Visits (Telemedicine).  Patients are able to view lab/test results, encounter notes, upcoming appointments, etc.  Non-urgent messages can be sent to your provider as well.   To learn more about what you can do with MyChart, go to [email or web address].    Your next appointment:   12 month(s)  Provider:   Rollene Rotunda, MD     Other Instructions Referral placed to see a PharmD regarding GLP-1 initiation.

## 2022-07-04 LAB — LIPOPROTEIN A (LPA): Lipoprotein (a): <8.4 nmol/L (ref <75.0)

## 2022-07-26 ENCOUNTER — Encounter: Payer: Self-pay | Admitting: Internal Medicine

## 2022-08-10 ENCOUNTER — Ambulatory Visit: Payer: No Typology Code available for payment source | Attending: Cardiovascular Disease | Admitting: Pharmacist

## 2022-08-10 ENCOUNTER — Encounter: Payer: Self-pay | Admitting: Pharmacist

## 2022-08-10 VITALS — Wt 225.2 lb

## 2022-08-10 DIAGNOSIS — I2510 Atherosclerotic heart disease of native coronary artery without angina pectoris: Secondary | ICD-10-CM | POA: Diagnosis not present

## 2022-08-10 DIAGNOSIS — R7302 Impaired glucose tolerance (oral): Secondary | ICD-10-CM | POA: Diagnosis not present

## 2022-08-10 NOTE — Progress Notes (Signed)
Patient ID: Brett Griffith                 DOB: October 31, 1960                    MRN: [id number]     HPI: Brett Griffith is a 62 y.o. male patient referred to pharmacy clinic by Dr Antoine Poche to initiate weight loss therapy with GLP1-RA. PMH is significant for obesity, CAD, HTN, Tpre DM and HLD.. Most recent BMI 34.45.  Patient presents today to discuss GLP1a. Has a family history of early CAD. A1c has been steadily increasing over the past 5 years.  Reports he does not eat much. Uses dessert plates for dinner and only eats with his wife. Was a vegetarian for a year however this did not help his A1c. Was going to the gym 5 times a week until last August. Works as a Emergency planning/management officer for [employer]. Previously worked for the [employer].   Most recent A1c 6.4% on 03/01/22. Reviewed previous lab results, none 6.5 or greater.   Labs: Lab Results  Component Value Date   HGBA1C 6.4 03/01/2022    Wt Readings from Last 1 Encounters:  07/01/22 220 lb (99.8 kg)    BP Readings from Last 1 Encounters:  07/01/22 132/76   Pulse Readings from Last 1 Encounters:  07/01/22 68       Component Value Date/Time   CHOL 110 03/01/2022 0731   CHOL 126 01/21/2021 0939   TRIG 81.0 03/01/2022 0731   HDL 55.60 03/01/2022 0731   HDL 63 01/21/2021 0939   CHOLHDL 2 03/01/2022 0731   VLDL 16.2 03/01/2022 0731   LDLCALC 38 03/01/2022 0731   LDLCALC 48 01/21/2021 0939   LDLDIRECT 51 12/30/2019 0848   LDLDIRECT 123 03/31/2015 0838    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 90 tablet 3   aspirin EC 81 MG tablet Take 1 tablet (81 mg total) by mouth daily. 90 tablet 11   Coenzyme Q10 (CO Q-10 PO) Take 1 tablet by mouth daily.     cyanocobalamin (VITAMIN B12) 500 MCG tablet Take 500 mcg by mouth daily.     cyclobenzaprine (FLEXERIL) 5 MG tablet Take 1 tablet (5 mg total) by mouth 3 (three) times daily as needed for muscle spasms. 90 tablet 3   Diclofenac Sodium 2 % SOLN Place 2 g onto the skin 2 (two) times daily. 112 g 3   ezetimibe (ZETIA) 10 MG tablet Take 1 tablet (10 mg total) by mouth daily. 90 tablet 3   LUTEIN-ZEAXANTHIN PO Take by mouth.     Multiple Vitamin (MULTIVITAMIN) tablet Take 1 tablet by mouth daily.     OVER THE COUNTER MEDICATION Tumeric 100 mg. One tablet daily.     rosuvastatin (CRESTOR) 40 MG tablet Take 1 tablet (40 mg total) by mouth daily. 90 tablet 3   No current facility-administered medications on file prior to  visit.    Allergies  Allergen Reactions   Ezetimibe-Simvastatin     REACTION: more emotional     Assessment/Plan:  1. Weight loss/Pre DM - Patient's last A1c 6.4 which places him as pre diabetic. Discussed differences between Ozempic/Wegovy and Mounjaro/Zepbound and how the former are approved for DM and latter are approved for weight loss. Will update A1c today to decide which medications to pursue prior authorizations for.   Using [employer] [employer] [place], and Zepbound, educated on storage, mechanism of action, and administration. Patient voiced understanding. Has no history of medullary thyroid cancer or MEN2.  Advised patient on common side effects including nausea, diarrhea, dyspepsia, decreased appetite, and fatigue. Counseled patient on reducing meal size and how to titrate medication to minimize side effects. Counseled patient to call if intolerable side effects or if experiencing dehydration, abdominal pain, or dizziness. Patient will  adhere to dietary modifications and will target at least 150 minutes of moderate intensity exercise weekly.   Laural Golden, PharmD, BCACP, CDCES, CPP [address], [address] [place], [place], [address] Phone: [phone], Fax: [phone]

## 2022-08-10 NOTE — Patient Instructions (Addendum)
It was nice meeting you today  We will check an A1c today to see what your current level is  I'll get the result back tomorrow and then proceed with the prior authorization  The medications we discussed today are Ozempic/Wegovy and Mounjaro/Zepbound  Please contact me with any questions  Laural Golden, PharmD, BCACP, CDCES, CPP [address], [address] [place], [place], [address] Phone: [phone], Fax: [phone] +

## 2022-08-11 ENCOUNTER — Telehealth: Payer: Self-pay | Admitting: Pharmacist

## 2022-08-11 LAB — HEMOGLOBIN A1C
Est. average glucose Bld gHb Est-mCnc: 134 mg/dL
Hgb A1c MFr Bld: 6.3 % — ABNORMAL HIGH (ref 4.8–5.6)

## 2022-08-11 NOTE — Telephone Encounter (Signed)
PA for [facility] submitted.  Key: [id number]

## 2022-08-18 ENCOUNTER — Other Ambulatory Visit (HOSPITAL_COMMUNITY): Payer: Self-pay

## 2022-08-18 ENCOUNTER — Telehealth: Payer: Self-pay

## 2022-08-18 NOTE — Telephone Encounter (Signed)
Pharmacy Patient Advocate Encounter   Received notification from [facility] that prior authorization for [facility] is required/requested.   PA submitted on 5.23.24 to (ins) [email or web address] via PROMPTPA  Status is pending

## 2022-08-19 ENCOUNTER — Encounter: Payer: Self-pay | Admitting: Pharmacist

## 2022-08-19 ENCOUNTER — Other Ambulatory Visit (HOSPITAL_BASED_OUTPATIENT_CLINIC_OR_DEPARTMENT_OTHER): Payer: Self-pay

## 2022-08-19 DIAGNOSIS — E663 Overweight: Secondary | ICD-10-CM

## 2022-08-19 MED ORDER — WEGOVY 0.25 MG/0.5ML ~~LOC~~ SOAJ
0.2500 mg | SUBCUTANEOUS | 0 refills | Status: DC
Start: 2022-08-19 — End: 2022-09-14
  Filled 2022-08-19: qty 2, 28d supply, fill #0

## 2022-08-19 NOTE — Addendum Note (Signed)
Addended by: Cheree Ditto on: 08/19/2022 10:39 AM   Modules accepted: Orders

## 2022-08-19 NOTE — Telephone Encounter (Signed)
Pharmacy Patient Advocate Encounter  Prior Authorization for [facility] has been approved.    Effective dates: 08/18/22 through 03/20/23  Haze Rushing, CPhT Pharmacy Patient Advocate Specialist Direct Number: [phone] Fax: [phone]

## 2022-08-22 ENCOUNTER — Other Ambulatory Visit (HOSPITAL_COMMUNITY): Payer: Self-pay

## 2022-08-23 ENCOUNTER — Other Ambulatory Visit (HOSPITAL_COMMUNITY): Payer: Self-pay

## 2022-08-23 ENCOUNTER — Other Ambulatory Visit (HOSPITAL_BASED_OUTPATIENT_CLINIC_OR_DEPARTMENT_OTHER): Payer: Self-pay

## 2022-08-24 ENCOUNTER — Other Ambulatory Visit (HOSPITAL_BASED_OUTPATIENT_CLINIC_OR_DEPARTMENT_OTHER): Payer: Self-pay

## 2022-09-14 ENCOUNTER — Other Ambulatory Visit (HOSPITAL_BASED_OUTPATIENT_CLINIC_OR_DEPARTMENT_OTHER): Payer: Self-pay

## 2022-09-14 MED ORDER — WEGOVY 0.5 MG/0.5ML ~~LOC~~ SOAJ
0.5000 mg | SUBCUTANEOUS | 0 refills | Status: DC
Start: 2022-09-14 — End: 2022-10-14
  Filled 2022-09-14: qty 2, 28d supply, fill #0

## 2022-09-14 NOTE — Addendum Note (Signed)
Addended by: Cheree Ditto on: 09/14/2022 07:14 AM   Modules accepted: Orders

## 2022-10-14 ENCOUNTER — Other Ambulatory Visit (HOSPITAL_BASED_OUTPATIENT_CLINIC_OR_DEPARTMENT_OTHER): Payer: Self-pay

## 2022-10-14 ENCOUNTER — Encounter: Payer: Self-pay | Admitting: Pharmacist

## 2022-10-14 ENCOUNTER — Other Ambulatory Visit: Payer: Self-pay | Admitting: Pharmacist

## 2022-10-14 DIAGNOSIS — E663 Overweight: Secondary | ICD-10-CM

## 2022-10-14 MED ORDER — WEGOVY 1 MG/0.5ML ~~LOC~~ SOAJ
1.0000 mg | SUBCUTANEOUS | 0 refills | Status: DC
  Filled 2022-10-14: qty 2, 28d supply, fill #0

## 2022-11-13 ENCOUNTER — Encounter: Payer: Self-pay | Admitting: Pharmacist

## 2022-11-13 DIAGNOSIS — E663 Overweight: Secondary | ICD-10-CM

## 2022-11-15 ENCOUNTER — Other Ambulatory Visit (HOSPITAL_BASED_OUTPATIENT_CLINIC_OR_DEPARTMENT_OTHER): Payer: Self-pay

## 2022-11-15 MED ORDER — WEGOVY 1.7 MG/0.75ML ~~LOC~~ SOAJ
1.7000 mg | SUBCUTANEOUS | 0 refills | Status: DC
Start: 2022-11-15 — End: 2022-12-15
  Filled 2022-11-15 – 2022-11-23 (×2): qty 3, 28d supply, fill #0

## 2022-11-15 NOTE — Addendum Note (Signed)
Addended by: Cheree Ditto on: 11/15/2022 07:26 AM   Modules accepted: Orders

## 2022-11-16 ENCOUNTER — Other Ambulatory Visit (HOSPITAL_BASED_OUTPATIENT_CLINIC_OR_DEPARTMENT_OTHER): Payer: Self-pay

## 2022-11-16 ENCOUNTER — Telehealth: Payer: Self-pay | Admitting: Pharmacist

## 2022-11-16 ENCOUNTER — Telehealth: Payer: Self-pay

## 2022-11-16 ENCOUNTER — Other Ambulatory Visit (HOSPITAL_COMMUNITY): Payer: Self-pay

## 2022-11-16 NOTE — Telephone Encounter (Signed)
PA request has been Submitted. New Encounter created for follow up. For additional info see Pharmacy Prior Auth telephone encounter from 11/16/22.

## 2022-11-16 NOTE — Telephone Encounter (Signed)
Please complete expedited PA for wegovy 1.7mg 

## 2022-11-16 NOTE — Telephone Encounter (Signed)
Pharmacy Patient Advocate Encounter   Received notification from Physician's Office that prior authorization for [facility] is required/requested.   Insurance verification completed.   The patient is insured through [facility] .   Per test claim: PA required; PA submitted to [facility] via CoverMyMeds Key/confirmation #/EOC [id number] Status is pending

## 2022-11-17 ENCOUNTER — Other Ambulatory Visit (HOSPITAL_BASED_OUTPATIENT_CLINIC_OR_DEPARTMENT_OTHER): Payer: Self-pay

## 2022-11-17 ENCOUNTER — Telehealth: Payer: Self-pay

## 2022-11-17 ENCOUNTER — Other Ambulatory Visit (HOSPITAL_COMMUNITY): Payer: Self-pay

## 2022-11-17 NOTE — Telephone Encounter (Signed)
Pharmacy Patient Advocate Encounter   Received notification from Physician's Office that prior authorization for [facility] is required/requested.   Per test claim: PA required; PA submitted to [facility] via Prompt PA Key/confirmation #/EOC [id number] Status is pending

## 2022-11-17 NOTE — Telephone Encounter (Signed)
PA request has been Submitted in [employer]. New Encounter created for follow up. For additional info see Pharmacy Prior Auth telephone encounter from 11/17/22.

## 2022-11-17 NOTE — Telephone Encounter (Signed)
Pharmacy Patient Advocate Encounter  Received notification from [facility] that Prior Authorization for [facility] has been DENIED. Please advise how you'd like to proceed. Full denial letter will be uploaded to the media tab. See denial reason below.  PLAN EXCLUSION

## 2022-11-17 NOTE — Telephone Encounter (Signed)
Pharmacy Patient Advocate Encounter  Received notification from [facility] that Prior Authorization for [facility] has been DENIED. Please advise how you'd like to proceed. Full denial letter will be uploaded to the media tab. See denial reason below. PLAN EXCLUSION Patient has two plans, submitting to other now in PromptPA

## 2022-11-18 ENCOUNTER — Other Ambulatory Visit (HOSPITAL_BASED_OUTPATIENT_CLINIC_OR_DEPARTMENT_OTHER): Payer: Self-pay

## 2022-11-18 NOTE — Telephone Encounter (Signed)
Message routed to pt via my chart.

## 2022-11-22 ENCOUNTER — Encounter: Payer: Self-pay | Admitting: Pharmacist

## 2022-11-23 ENCOUNTER — Other Ambulatory Visit (HOSPITAL_BASED_OUTPATIENT_CLINIC_OR_DEPARTMENT_OTHER): Payer: Self-pay

## 2022-11-28 ENCOUNTER — Encounter: Payer: Self-pay | Admitting: Pharmacist

## 2022-11-28 DIAGNOSIS — I2510 Atherosclerotic heart disease of native coronary artery without angina pectoris: Secondary | ICD-10-CM

## 2022-11-28 DIAGNOSIS — E663 Overweight: Secondary | ICD-10-CM

## 2022-12-15 ENCOUNTER — Other Ambulatory Visit (HOSPITAL_BASED_OUTPATIENT_CLINIC_OR_DEPARTMENT_OTHER): Payer: Self-pay

## 2022-12-15 MED ORDER — WEGOVY 2.4 MG/0.75ML ~~LOC~~ SOAJ
2.4000 mg | SUBCUTANEOUS | 5 refills | Status: DC
Start: 2022-12-15 — End: 2023-06-15
  Filled 2022-12-15: qty 3, 28d supply, fill #0
  Filled 2023-01-16: qty 3, 28d supply, fill #1
  Filled 2023-02-10: qty 3, 28d supply, fill #2
  Filled 2023-03-12: qty 3, 28d supply, fill #3
  Filled 2023-04-07: qty 3, 28d supply, fill #4
  Filled 2023-05-08: qty 3, 28d supply, fill #5

## 2022-12-15 NOTE — Addendum Note (Signed)
Addended by: Cheree Ditto on: 12/15/2022 07:47 AM   Modules accepted: Orders

## 2023-01-02 ENCOUNTER — Other Ambulatory Visit (HOSPITAL_BASED_OUTPATIENT_CLINIC_OR_DEPARTMENT_OTHER): Payer: Self-pay

## 2023-01-12 ENCOUNTER — Other Ambulatory Visit (HOSPITAL_COMMUNITY): Payer: Self-pay

## 2023-01-16 ENCOUNTER — Other Ambulatory Visit (HOSPITAL_BASED_OUTPATIENT_CLINIC_OR_DEPARTMENT_OTHER): Payer: Self-pay

## 2023-02-10 ENCOUNTER — Other Ambulatory Visit (HOSPITAL_BASED_OUTPATIENT_CLINIC_OR_DEPARTMENT_OTHER): Payer: Self-pay

## 2023-02-19 ENCOUNTER — Encounter: Payer: Self-pay | Admitting: Internal Medicine

## 2023-02-22 ENCOUNTER — Other Ambulatory Visit (INDEPENDENT_AMBULATORY_CARE_PROVIDER_SITE_OTHER): Payer: No Typology Code available for payment source

## 2023-02-22 DIAGNOSIS — Z125 Encounter for screening for malignant neoplasm of prostate: Secondary | ICD-10-CM | POA: Diagnosis not present

## 2023-02-22 DIAGNOSIS — E559 Vitamin D deficiency, unspecified: Secondary | ICD-10-CM

## 2023-02-22 DIAGNOSIS — E538 Deficiency of other specified B group vitamins: Secondary | ICD-10-CM

## 2023-02-22 DIAGNOSIS — E7800 Pure hypercholesterolemia, unspecified: Secondary | ICD-10-CM

## 2023-02-22 DIAGNOSIS — R7302 Impaired glucose tolerance (oral): Secondary | ICD-10-CM

## 2023-02-22 LAB — TSH: TSH: 1.33 u[IU]/mL (ref 0.35–5.50)

## 2023-02-22 LAB — URINALYSIS, ROUTINE W REFLEX MICROSCOPIC
Bilirubin Urine: NEGATIVE
Hgb urine dipstick: NEGATIVE
Ketones, ur: NEGATIVE
Leukocytes,Ua: NEGATIVE
Nitrite: NEGATIVE
RBC / HPF: NONE SEEN (ref 0–?)
Specific Gravity, Urine: 1.02 (ref 1.000–1.030)
Total Protein, Urine: NEGATIVE
Urine Glucose: NEGATIVE
Urobilinogen, UA: 0.2 (ref 0.0–1.0)
pH: 6 (ref 5.0–8.0)

## 2023-02-22 LAB — VITAMIN D 25 HYDROXY (VIT D DEFICIENCY, FRACTURES): VITD: 32.28 ng/mL (ref 30.00–100.00)

## 2023-02-22 LAB — CBC WITH DIFFERENTIAL/PLATELET
Basophils Absolute: 0 10*3/uL (ref 0.0–0.1)
Basophils Relative: 0.7 % (ref 0.0–3.0)
Eosinophils Absolute: 0.3 10*3/uL (ref 0.0–0.7)
Eosinophils Relative: 4.8 % (ref 0.0–5.0)
HCT: 45.2 % (ref 39.0–52.0)
Hemoglobin: 15.3 g/dL (ref 13.0–17.0)
Lymphocytes Relative: 29.4 % (ref 12.0–46.0)
Lymphs Abs: 1.8 10*3/uL (ref 0.7–4.0)
MCHC: 33.9 g/dL (ref 30.0–36.0)
MCV: 91.5 fL (ref 78.0–100.0)
Monocytes Absolute: 0.6 10*3/uL (ref 0.1–1.0)
Monocytes Relative: 9.6 % (ref 3.0–12.0)
Neutro Abs: 3.3 10*3/uL (ref 1.4–7.7)
Neutrophils Relative %: 55.5 % (ref 43.0–77.0)
Platelets: 255 10*3/uL (ref 150.0–400.0)
RBC: 4.94 Mil/uL (ref 4.22–5.81)
RDW: 13.5 % (ref 11.5–15.5)
WBC: 6 10*3/uL (ref 4.0–10.5)

## 2023-02-22 LAB — BASIC METABOLIC PANEL WITH GFR
BUN: 14 mg/dL (ref 6–23)
CO2: 28 meq/L (ref 19–32)
Calcium: 9.5 mg/dL (ref 8.4–10.5)
Chloride: 104 meq/L (ref 96–112)
Creatinine, Ser: 0.87 mg/dL (ref 0.40–1.50)
GFR: 93.74 mL/min
Glucose, Bld: 102 mg/dL — ABNORMAL HIGH (ref 70–99)
Potassium: 4.1 meq/L (ref 3.5–5.1)
Sodium: 141 meq/L (ref 135–145)

## 2023-02-22 LAB — HEPATIC FUNCTION PANEL
ALT: 36 U/L (ref 0–53)
AST: 18 U/L (ref 0–37)
Albumin: 4.5 g/dL (ref 3.5–5.2)
Alkaline Phosphatase: 56 U/L (ref 39–117)
Bilirubin, Direct: 0.2 mg/dL (ref 0.0–0.3)
Total Bilirubin: 0.7 mg/dL (ref 0.2–1.2)
Total Protein: 6.4 g/dL (ref 6.0–8.3)

## 2023-02-22 LAB — HEMOGLOBIN A1C: Hgb A1c MFr Bld: 5.9 % (ref 4.6–6.5)

## 2023-02-22 LAB — LIPID PANEL
Cholesterol: 96 mg/dL (ref 0–200)
HDL: 43.2 mg/dL (ref >39.00)
LDL Cholesterol: 41 mg/dL (ref 0–99)
NonHDL: 52.68
Total CHOL/HDL Ratio: 2
Triglycerides: 58 mg/dL (ref 0.0–149.0)
VLDL: 11.6 mg/dL (ref 0.0–40.0)

## 2023-02-22 LAB — VITAMIN B12: Vitamin B-12: 399 pg/mL (ref 211–911)

## 2023-02-22 LAB — PSA: PSA: 0.91 ng/mL (ref 0.10–4.00)

## 2023-03-03 ENCOUNTER — Encounter: Payer: No Typology Code available for payment source | Admitting: Internal Medicine

## 2023-03-04 ENCOUNTER — Other Ambulatory Visit: Payer: Self-pay | Admitting: Internal Medicine

## 2023-03-06 ENCOUNTER — Other Ambulatory Visit: Payer: Self-pay

## 2023-03-09 ENCOUNTER — Other Ambulatory Visit (HOSPITAL_BASED_OUTPATIENT_CLINIC_OR_DEPARTMENT_OTHER): Payer: Self-pay

## 2023-03-09 ENCOUNTER — Ambulatory Visit (INDEPENDENT_AMBULATORY_CARE_PROVIDER_SITE_OTHER): Payer: No Typology Code available for payment source | Admitting: Internal Medicine

## 2023-03-09 VITALS — BP 132/82 | HR 75 | Temp 98.4°F | Ht 67.0 in | Wt 182.0 lb

## 2023-03-09 DIAGNOSIS — Z0001 Encounter for general adult medical examination with abnormal findings: Secondary | ICD-10-CM

## 2023-03-09 DIAGNOSIS — R7302 Impaired glucose tolerance (oral): Secondary | ICD-10-CM

## 2023-03-09 DIAGNOSIS — R9431 Abnormal electrocardiogram [ECG] [EKG]: Secondary | ICD-10-CM | POA: Diagnosis not present

## 2023-03-09 DIAGNOSIS — E538 Deficiency of other specified B group vitamins: Secondary | ICD-10-CM

## 2023-03-09 DIAGNOSIS — Z1211 Encounter for screening for malignant neoplasm of colon: Secondary | ICD-10-CM | POA: Diagnosis not present

## 2023-03-09 DIAGNOSIS — E7800 Pure hypercholesterolemia, unspecified: Secondary | ICD-10-CM

## 2023-03-09 DIAGNOSIS — Z125 Encounter for screening for malignant neoplasm of prostate: Secondary | ICD-10-CM

## 2023-03-09 DIAGNOSIS — E559 Vitamin D deficiency, unspecified: Secondary | ICD-10-CM | POA: Diagnosis not present

## 2023-03-09 DIAGNOSIS — I10 Essential (primary) hypertension: Secondary | ICD-10-CM

## 2023-03-09 DIAGNOSIS — Z23 Encounter for immunization: Secondary | ICD-10-CM

## 2023-03-09 MED ORDER — EZETIMIBE 10 MG PO TABS
10.0000 mg | ORAL_TABLET | Freq: Every day | ORAL | 3 refills | Status: DC
  Filled 2023-03-09 – 2023-07-07 (×2): qty 90, 90d supply, fill #0
  Filled 2023-10-06: qty 90, 90d supply, fill #1
  Filled 2024-01-08: qty 90, 90d supply, fill #2

## 2023-03-09 MED ORDER — CYCLOBENZAPRINE HCL 5 MG PO TABS
5.0000 mg | ORAL_TABLET | Freq: Three times a day (TID) | ORAL | 3 refills | Status: AC | PRN
  Filled 2023-03-09: qty 90, 30d supply, fill #0
  Filled 2023-11-20 – 2023-12-07 (×2): qty 90, 30d supply, fill #1

## 2023-03-09 MED ORDER — ROSUVASTATIN CALCIUM 40 MG PO TABS
40.0000 mg | ORAL_TABLET | Freq: Every day | ORAL | 3 refills | Status: DC
  Filled 2023-03-09 – 2023-07-07 (×2): qty 90, 90d supply, fill #0
  Filled 2023-10-06: qty 90, 90d supply, fill #1
  Filled 2024-01-08: qty 90, 90d supply, fill #2

## 2023-03-09 MED ORDER — AMLODIPINE BESYLATE 2.5 MG PO TABS
2.5000 mg | ORAL_TABLET | Freq: Every day | ORAL | 3 refills | Status: DC
  Filled 2023-03-09 – 2023-07-07 (×2): qty 90, 90d supply, fill #0
  Filled 2023-10-06: qty 90, 90d supply, fill #1
  Filled 2024-01-08: qty 90, 90d supply, fill #2

## 2023-03-09 NOTE — Progress Notes (Signed)
Patient ID: Brett Griffith, male   DOB: August 30, 1960, 62 y.o.   MRN: [id number]         Chief Complaint:: wellness exam and low vit d, low b12, hyperglycemia, hld, htn       HPI:  Brett Griffith is a 62 y.o. male here for wellness exam; due for colonoscopy, declines covid booster, o/w up to date                        Also Pt denies chest pain, increased sob or doe, wheezing, orthopnea, PND, increased LE swelling, palpitations, dizziness or syncope.   Pt denies polydipsia, polyuria, or new focal neuro s/s.    Pt denies fever, wt loss, night sweats, loss of appetite, or other constitutional symptoms     Wt Readings from Last 3 Encounters:  03/09/23 182 lb (82.6 kg)  08/10/22 225 lb 3.2 oz (102.2 kg)  07/01/22 220 lb (99.8 kg)   BP Readings from Last 3 Encounters:  03/09/23 132/82  07/01/22 132/76  03/01/22 126/68   Immunization History  Administered Date(s) Administered   DTaP 06/28/2010   Influenza Inj Mdck Quad Pf 01/27/2022   Influenza Split 12/26/2011   Influenza, Seasonal, Injecte, Preservative Fre 03/09/2023   Influenza,inj,Quad PF,6+ Mos 12/05/2013, 02/25/2015, 02/04/2016   Influenza-Unspecified 01/25/2017, 12/26/2017, 12/11/2018, 01/20/2020, 02/12/2021   PFIZER(Purple Top)SARS-COV-2 Vaccination 05/20/2019, 06/24/2019, 01/06/2020   Td 08/27/2007   Tdap 02/09/2018   Zoster Recombinant(Shingrix) 03/23/2019, 05/25/2019   Health Maintenance Due  Topic Date Due   Colonoscopy  04/25/2022   COVID-19 Vaccine (4 - 2024-25 season) 11/27/2022      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   moderate-severe right post neck   Atypical nevus 02/26/2018   left post sholder superior--moderate   History of kidney stones    HYPERLIPIDEMIA 01/08/2009   Impaired glucose tolerance 09/21/2010   NEPHROLITHIASIS, HX OF 01/08/2009   Shingles    TIA (transient ischemic attack)    Past Surgical History:  Procedure Laterality Date   INSERTION OF MESH  N/A 08/10/2016   Procedure: INSERTION OF MESH;  Surgeon: Berna Bue, MD;  Location: [facility];  Service: General;  Laterality: N/A;   s/p ganglion cyst     right wrist   s/p pilonidal cystectomy     UMBILICAL HERNIA REPAIR N/A 08/10/2016   Procedure: UMBILICAL HERNIA REPAIR;  Surgeon: Berna Bue, MD;  Location: [facility];  Service: General;  Laterality: N/A;   WISDOM TOOTH EXTRACTION      reports that he has never smoked. He has never used smokeless tobacco. He reports current alcohol use of about 2.0 standard drinks of alcohol per week. He reports that he does not use drugs. family history includes CAD (age of onset: 27) in his brother; CAD (age of onset: 46) in his father; Hypertension in his brother, father, and mother; Peripheral vascular disease (age of onset: 54) in his mother. Allergies  Allergen Reactions   Ezetimibe-Simvastatin     REACTION: more emotional   Current Outpatient Medications on File Prior to Visit  Medication Sig Dispense Refill   cyanocobalamin (VITAMIN B12) 500 MCG tablet Take 500 mcg by mouth daily.     Diclofenac Sodium 2 % SOLN Place 2 g onto the skin 2 (two) times daily. 112 g 3   LUTEIN-ZEAXANTHIN PO Take by mouth.     Semaglutide-Weight Management (WEGOVY) 2.4 MG/0.75ML  SOAJ Inject 2.4 mg into the skin once a week. 3 mL 5   aspirin EC 81 MG tablet Take 1 tablet (81 mg total) by mouth daily. 90 tablet 11   Coenzyme Q10 (CO Q-10 PO) Take 1 tablet by mouth daily.     Multiple Vitamin (MULTIVITAMIN) tablet Take 1 tablet by mouth daily.     OVER THE COUNTER MEDICATION Tumeric 100 mg. One tablet daily.     No current facility-administered medications on file prior to visit.        ROS:  All others reviewed and negative.  Objective        PE:  BP 132/82 (BP Location: Left Arm, Patient Position: Sitting, Cuff Size: Normal)   Pulse 75   Temp 98.4 F (36.9 C) (Oral)   Ht 5\' 7"  (1.702 m)   Wt 182 lb (82.6 kg)    SpO2 98%   BMI 28.51 kg/m                 Constitutional: Pt appears in NAD               HENT: Head: NCAT.                Right Ear: External ear normal.                 Left Ear: External ear normal.                Eyes: . Pupils are equal, round, and reactive to light. Conjunctivae and EOM are normal               Nose: without d/c or deformity               Neck: Neck supple. Gross normal ROM               Cardiovascular: Normal rate and regular rhythm.                 Pulmonary/Chest: Effort normal and breath sounds without rales or wheezing.                Abd:  Soft, NT, ND, + BS, no organomegaly               Neurological: Pt is alert. At baseline orientation, motor grossly intact               Skin: Skin is warm. No rashes, no other new lesions, LE edema - none               Psychiatric: Pt behavior is normal without agitation   Micro: none  Cardiac tracings I have personally interpreted today:  none  Pertinent Radiological findings (summarize): none   Lab Results  Component Value Date   WBC 6.0 02/22/2023   HGB 15.3 02/22/2023   HCT 45.2 02/22/2023   PLT 255.0 02/22/2023   GLUCOSE 102 (H) 02/22/2023   CHOL 96 02/22/2023   TRIG 58.0 02/22/2023   HDL 43.20 02/22/2023   LDLDIRECT 51 12/30/2019   LDLCALC 41 02/22/2023   ALT 36 02/22/2023   AST 18 02/22/2023   NA 141 02/22/2023   K 4.1 02/22/2023   CL 104 02/22/2023   CREATININE 0.87 02/22/2023   BUN 14 02/22/2023   CO2 28 02/22/2023   TSH 1.33 02/22/2023   PSA 0.91 02/22/2023   INR 0.97 10/07/2009   HGBA1C 5.9 02/22/2023   Assessment/Plan:  Brett Griffith is a 62 y.o.  White or Caucasian [1] male with  has a past medical history of Allergy, Atypical nevus (02/23/2017), Atypical nevus (02/26/2018), History of kidney stones, HYPERLIPIDEMIA (01/08/2009), Impaired glucose tolerance (09/21/2010), NEPHROLITHIASIS, HX OF (01/08/2009), Shingles, and TIA (transient ischemic attack).  Encounter for well adult exam with  abnormal findings Age and sex appropriate education and counseling updated with regular exercise and diet Referrals for preventative services - for colonoscopy,  Immunizations addressed - declines covid booster Smoking counseling  - none needed Evidence for depression or other mood disorder - none significant Most recent labs reviewed. I have personally reviewed and have noted: 1) the patient's medical and social history 2) The patient's current medications and supplements 3) The patient's height, weight, and BMI have been recorded in the chart   Impaired glucose tolerance Lab Results  Component Value Date   HGBA1C 5.9 02/22/2023   Stable, pt to continue current medical treatment  - diet, wt control   Hyperlipidemia Lab Results  Component Value Date   LDLCALC 41 02/22/2023   Stable, pt to continue current statin zetia 10 every day, crestor 40 every day, for f/u card ct score    Essential hypertension BP Readings from Last 3 Encounters:  03/09/23 132/82  07/01/22 132/76  03/01/22 126/68   Stable, pt to continue medical treatment norvasc 2.5 mg qd   B12 deficiency Lab Results  Component Value Date   VITAMINB12 399 02/22/2023   Stable, cont oral replacement - b12 1000 mcg qd   Vitamin D deficiency Last vitamin D Lab Results  Component Value Date   VD25OH 32.28 02/22/2023   Low, to start oral replacement  Followup: Return in about 1 year (around 03/08/2024).  Oliver Barre, MD 03/11/2023 6:07 PM [facility]

## 2023-03-09 NOTE — Patient Instructions (Addendum)
Please consider the RSV shot at the pharmacy  Please take OTC Vitamin D3 at 2000 units per day, indefinitely, and also the B12 1000 mcg per day OTC for at least another 6 months  Please continue all other medications as before, and refills have been done if requested.  Please have the pharmacy call with any other refills you may need.  Please continue your efforts at being more active, low cholesterol diet, and weight control.  You are otherwise up to date with prevention measures today.  Please keep your appointments with your specialists as you may have planned - Cardiology  You will be contacted regarding the referral for: colonoscopy  You will be contacted regarding the referral for: Cardiac CT score repeat  Please make an Appointment to return for your 1 year visit, or sooner if needed, with Lab testing by Appointment as well, to be done about 3-5 days before at the FIRST FLOOR Lab (so this is for TWO appointments - please see the scheduling desk as you leave)

## 2023-03-11 ENCOUNTER — Encounter: Payer: Self-pay | Admitting: Internal Medicine

## 2023-03-11 DIAGNOSIS — E559 Vitamin D deficiency, unspecified: Secondary | ICD-10-CM | POA: Insufficient documentation

## 2023-03-11 NOTE — Assessment & Plan Note (Signed)
Age and sex appropriate education and counseling updated with regular exercise and diet Referrals for preventative services - for colonoscopy,  Immunizations addressed - declines covid booster Smoking counseling  - none needed Evidence for depression or other mood disorder - none significant Most recent labs reviewed. I have personally reviewed and have noted: 1) the patient's medical and social history 2) The patient's current medications and supplements 3) The patient's height, weight, and BMI have been recorded in the chart

## 2023-03-11 NOTE — Assessment & Plan Note (Addendum)
Lab Results  Component Value Date   LDLCALC 41 02/22/2023   Stable, pt to continue current statin zetia 10 every day, crestor 40 every day, for f/u card ct score

## 2023-03-11 NOTE — Assessment & Plan Note (Signed)
Last vitamin D Lab Results  Component Value Date   VD25OH 32.28 02/22/2023   Low, to start oral replacement

## 2023-03-11 NOTE — Assessment & Plan Note (Signed)
Lab Results  Component Value Date   HGBA1C 5.9 02/22/2023   Stable, pt to continue current medical treatment  - diet, wt control

## 2023-03-11 NOTE — Assessment & Plan Note (Signed)
BP Readings from Last 3 Encounters:  03/09/23 132/82  07/01/22 132/76  03/01/22 126/68   Stable, pt to continue medical treatment norvasc 2.5 mg qd

## 2023-03-11 NOTE — Assessment & Plan Note (Signed)
Lab Results  Component Value Date   VITAMINB12 399 02/22/2023   Stable, cont oral replacement - b12 1000 mcg qd

## 2023-03-12 ENCOUNTER — Encounter: Payer: Self-pay | Admitting: Pharmacist

## 2023-03-13 ENCOUNTER — Other Ambulatory Visit (HOSPITAL_BASED_OUTPATIENT_CLINIC_OR_DEPARTMENT_OTHER): Payer: Self-pay

## 2023-03-30 ENCOUNTER — Encounter: Payer: Self-pay | Admitting: Internal Medicine

## 2023-04-07 ENCOUNTER — Other Ambulatory Visit (HOSPITAL_BASED_OUTPATIENT_CLINIC_OR_DEPARTMENT_OTHER): Payer: Self-pay

## 2023-04-20 ENCOUNTER — Ambulatory Visit: Payer: No Typology Code available for payment source

## 2023-04-20 VITALS — Ht 67.0 in | Wt 175.0 lb

## 2023-04-20 DIAGNOSIS — Z860100 Personal history of colon polyps, unspecified: Secondary | ICD-10-CM

## 2023-04-20 LAB — HM DIABETES EYE EXAM

## 2023-04-20 MED ORDER — SUFLAVE 178.7 G PO SOLR: 1.0000 | Freq: Once | ORAL | 0 refills | Status: AC

## 2023-04-20 NOTE — Progress Notes (Signed)
 No egg or soy allergy known to patient  No issues known to pt with past sedation with any surgeries or procedures Patient denies ever being told they had issues or difficulty with intubation  No FH of Malignant Hyperthermia Pt is not on diet pills Pt is not on  home 02  Pt is not on blood thinners  Pt denies issues with constipation  No A fib or A flutter Have any cardiac testing pending--no Pt instructed to use Single[employer]com or GoodRx for a price reduction on prep  Ambulates independently Patient's chart reviewed by Cathlyn Parsons CNRA prior to previsit and patient appropriate for the [facility].  Previsit completed and red dot placed by patient's name on their procedure day (on provider's schedule).

## 2023-04-24 ENCOUNTER — Encounter: Payer: Self-pay | Admitting: Internal Medicine

## 2023-05-05 ENCOUNTER — Ambulatory Visit (HOSPITAL_BASED_OUTPATIENT_CLINIC_OR_DEPARTMENT_OTHER)
Admission: RE | Admit: 2023-05-05 | Discharge: 2023-05-05 | Disposition: A | Discharge status: Home / Self Care | Payer: Self-pay | Source: Ambulatory Visit | Attending: Internal Medicine | Admitting: Internal Medicine

## 2023-05-05 DIAGNOSIS — I10 Essential (primary) hypertension: Secondary | ICD-10-CM | POA: Insufficient documentation

## 2023-05-05 DIAGNOSIS — R9431 Abnormal electrocardiogram [ECG] [EKG]: Secondary | ICD-10-CM | POA: Insufficient documentation

## 2023-05-05 DIAGNOSIS — R7302 Impaired glucose tolerance (oral): Secondary | ICD-10-CM | POA: Insufficient documentation

## 2023-05-05 DIAGNOSIS — E7800 Pure hypercholesterolemia, unspecified: Secondary | ICD-10-CM | POA: Insufficient documentation

## 2023-05-07 ENCOUNTER — Other Ambulatory Visit: Payer: Self-pay | Admitting: Internal Medicine

## 2023-05-07 ENCOUNTER — Encounter: Payer: Self-pay | Admitting: Internal Medicine

## 2023-05-07 MED ORDER — ASPIRIN 81 MG PO TBEC: 81.0000 mg | DELAYED_RELEASE_TABLET | Freq: Every day | ORAL | 99 refills | Status: AC

## 2023-05-08 ENCOUNTER — Other Ambulatory Visit (HOSPITAL_BASED_OUTPATIENT_CLINIC_OR_DEPARTMENT_OTHER): Payer: Self-pay

## 2023-05-18 ENCOUNTER — Encounter: Payer: Self-pay | Admitting: Internal Medicine

## 2023-05-23 ENCOUNTER — Encounter: Payer: Self-pay | Admitting: Internal Medicine

## 2023-05-23 ENCOUNTER — Ambulatory Visit (AMBULATORY_SURGERY_CENTER): Payer: No Typology Code available for payment source | Admitting: Internal Medicine

## 2023-05-23 VITALS — BP 120/67 | HR 80 | Temp 97.9°F | Resp 15 | Ht 67.0 in | Wt 175.0 lb

## 2023-05-23 DIAGNOSIS — Z860101 Personal history of adenomatous and serrated colon polyps: Secondary | ICD-10-CM

## 2023-05-23 DIAGNOSIS — Z860100 Personal history of colon polyps, unspecified: Secondary | ICD-10-CM

## 2023-05-23 DIAGNOSIS — Z1211 Encounter for screening for malignant neoplasm of colon: Secondary | ICD-10-CM | POA: Diagnosis present

## 2023-05-23 MED ORDER — SODIUM CHLORIDE 0.9 % IV SOLN: 500.0000 mL | Freq: Once | INTRAVENOUS | Status: DC

## 2023-05-23 NOTE — Patient Instructions (Signed)
  Resume previous diet. Continue present medications. Repeat colonoscopy in 5 years for surveillance   YOU HAD AN ENDOSCOPIC PROCEDURE TODAY AT [facility]:   Refer to the procedure report that was given to you for any specific questions about what was found during the examination.  If the procedure report does not answer your questions, please call your gastroenterologist to clarify.  If you requested that your care partner not be given the details of your procedure findings, then the procedure report has been included in a sealed envelope for you to review at your convenience later.  YOU SHOULD EXPECT: Some feelings of bloating in the abdomen. Passage of more gas than usual.  Walking can help get rid of the air that was put into your GI tract during the procedure and reduce the bloating. If you had a lower endoscopy (such as a colonoscopy or flexible sigmoidoscopy) you may notice spotting of blood in your stool or on the toilet paper. If you underwent a bowel prep for your procedure, you may not have a normal bowel movement for a few days.  Please Note:  You might notice some irritation and congestion in your nose or some drainage.  This is from the oxygen used during your procedure.  There is no need for concern and it should clear up in a day or so.  SYMPTOMS TO REPORT IMMEDIATELY:  Following lower endoscopy (colonoscopy or flexible sigmoidoscopy):  Excessive amounts of blood in the stool  Significant tenderness or worsening of abdominal pains  Swelling of the abdomen that is new, acute  Fever of 100F or higher  For urgent or emergent issues, a gastroenterologist can be reached at any hour by calling [phone]. Do not use MyChart messaging for urgent concerns.    DIET:  We do recommend a small meal at first, but then you may proceed to your regular diet.  Drink plenty of fluids but you should avoid alcoholic beverages for 24 hours.  ACTIVITY:  You should plan to  take it easy for the rest of today and you should NOT DRIVE or use heavy machinery until tomorrow (because of the sedation medicines used during the test).    FOLLOW UP: Our staff will call the number listed on your records the next business day following your procedure.  We will call around 7:15- 8:00 am to check on you and address any questions or concerns that you may have regarding the information given to you following your procedure. If we do not reach you, we will leave a message.     If any biopsies were taken you will be contacted by phone or by letter within the next 1-3 weeks.  Please call us at [phone] if you have not heard about the biopsies in 3 weeks.    SIGNATURES/CONFIDENTIALITY: You and/or your care partner have signed paperwork which will be entered into your electronic medical record.  These signatures attest to the fact that that the information above on your After Visit Summary has been reviewed and is understood.  Full responsibility of the confidentiality of this discharge information lies with you and/or your care-partner.

## 2023-05-23 NOTE — Progress Notes (Signed)
 Vss nad trans to pacu

## 2023-05-23 NOTE — Progress Notes (Signed)
 HISTORY OF PRESENT ILLNESS:  Brett Griffith is a 63 y.o. male with a history of advanced adenomatous polyps and sessile serrated polyps.  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   moderate-severe right post neck   Atypical nevus 02/26/2018   left post sholder superior--moderate   History of kidney stones    HYPERLIPIDEMIA 01/08/2009   Impaired glucose tolerance 09/21/2010   NEPHROLITHIASIS, HX OF 01/08/2009   Shingles    TIA (transient ischemic attack) 2010    Past Surgical History:  Procedure Laterality Date   INSERTION OF MESH N/A 08/10/2016   Procedure: INSERTION OF MESH;  Surgeon: Berna Bue, MD;  Location: [facility];  Service: General;  Laterality: N/A;   s/p ganglion cyst     right wrist   s/p pilonidal cystectomy     UMBILICAL HERNIA REPAIR N/A 08/10/2016   Procedure: UMBILICAL HERNIA REPAIR;  Surgeon: Berna Bue, MD;  Location: [facility];  Service: General;  Laterality: N/A;   WISDOM TOOTH EXTRACTION      Social History Brett Griffith  reports that he has never smoked. He has never used smokeless tobacco. He reports that he does not currently use alcohol after a past usage of about 1.0 standard drink of alcohol per week. He reports that he does not use drugs.  family history includes CAD (age of onset: 108) in his brother; CAD (age of onset: 75) in his father; Hypertension in his brother, father, and mother; Peripheral vascular disease (age of onset: 77) in his mother.  Allergies  Allergen Reactions   Ezetimibe-Simvastatin Other (See Comments)    REACTION: more emotional       PHYSICAL EXAMINATION: Vital signs: BP 136/77   Pulse 78   Temp 97.9 F (36.6 C)   Ht 5\' 7"  (1.702 m)   Wt 175 lb (79.4 kg)   SpO2 99%   BMI 27.41 kg/m  General: Well-developed, well-nourished, no acute  distress HEENT: Sclerae are anicteric, conjunctiva pink. Oral mucosa intact Lungs: Clear Heart: Regular Abdomen: soft, nontender, nondistended, no obvious ascites, no peritoneal signs, normal bowel sounds. No organomegaly. Extremities: No edema Psychiatric: alert and oriented x3. Cooperative     ASSESSMENT:   History of advanced adenomatous and sessile serrated polyps  PLAN:  Surveillance colonoscopy

## 2023-05-23 NOTE — Progress Notes (Signed)
 Pt's states no medical or surgical changes since previsit or office visit.

## 2023-05-23 NOTE — Op Note (Signed)
 [facility] Patient Name: Brett Griffith Procedure Date: 05/23/2023 9:14 AM MRN: [id number] Endoscopist: Wilhemina Bonito. Marina Goodell , MD, [id number] Age: 63 Referring MD:  Date of Birth: Jun 03, 1960 Gender: Male Account #: [id number] Procedure:                Colonoscopy Indications:              High risk colon cancer surveillance: Personal                            history of adenoma (10 mm or greater in size), High                            risk colon cancer surveillance: Personal history of                            sessile serrated colon polyp (less than 10 mm in                            size) with no dysplasia. Previous examinations                            2015, 2019 Medicines:                Monitored Anesthesia Care Procedure:                Pre-Anesthesia Assessment:                           - Prior to the procedure, a History and Physical                            was performed, and patient medications and                            allergies were reviewed. The patient's tolerance of                            previous anesthesia was also reviewed. The risks                            and benefits of the procedure and the sedation                            options and risks were discussed with the patient.                            All questions were answered, and informed consent                            was obtained. Prior Anticoagulants: The patient has                            taken no anticoagulant or antiplatelet agents. ASA  Grade Assessment: II - A patient with mild systemic                            disease. After reviewing the risks and benefits,                            the patient was deemed in satisfactory condition to                            undergo the procedure.                           After obtaining informed consent, the colonoscope                            was passed under direct vision. Throughout the                             procedure, the patient's blood pressure, pulse, and                            oxygen saturations were monitored continuously. The                            Olympus Scope [id number] was introduced through the                            anus and advanced to the the cecum, identified by                            appendiceal orifice and ileocecal valve. The                            ileocecal valve, appendiceal orifice, and rectum                            were photographed. The quality of the bowel                            preparation was excellent. The colonoscopy was                            performed without difficulty. The patient tolerated                            the procedure well. The bowel preparation used was                            SUPREP via split dose instruction. Scope In: 9:26:38 AM Scope Out: 9:41:15 AM Scope Withdrawal Time: 0 hours 9 minutes 54 seconds  Total Procedure Duration: 0 hours 14 minutes 37 seconds  Findings:                 The entire examined colon appeared normal on direct  and retroflexion views. Complications:            No immediate complications. Estimated blood loss:                            None. Estimated Blood Loss:     Estimated blood loss: none. Impression:               - The entire examined colon is normal on direct and                            retroflexion views.                           - No specimens collected. Recommendation:           - Repeat colonoscopy in 5 years for surveillance                            (personal history).                           - Patient has a contact number available for                            emergencies. The signs and symptoms of potential                            delayed complications were discussed with the                            patient. Return to normal activities tomorrow.                            Written discharge instructions were  provided to the                            patient.                           - Resume previous diet.                           - Continue present medications. Wilhemina Bonito. Marina Goodell, MD 05/23/2023 9:53:08 AM This report has been signed electronically.

## 2023-05-24 ENCOUNTER — Telehealth: Payer: Self-pay

## 2023-05-24 NOTE — Telephone Encounter (Signed)
 Attempted to reach patient for post-procedure f/u call. No answer. Left message for him to please not hesitate to call if he has any questions/concerns regarding his care.

## 2023-06-15 ENCOUNTER — Other Ambulatory Visit: Payer: Self-pay | Admitting: Cardiology

## 2023-06-15 ENCOUNTER — Other Ambulatory Visit (HOSPITAL_BASED_OUTPATIENT_CLINIC_OR_DEPARTMENT_OTHER): Payer: Self-pay

## 2023-06-15 DIAGNOSIS — I2510 Atherosclerotic heart disease of native coronary artery without angina pectoris: Secondary | ICD-10-CM

## 2023-06-15 DIAGNOSIS — E663 Overweight: Secondary | ICD-10-CM

## 2023-06-15 MED ORDER — WEGOVY 2.4 MG/0.75ML ~~LOC~~ SOAJ
2.4000 mg | SUBCUTANEOUS | 5 refills | Status: DC
  Filled 2023-06-15: qty 3, 28d supply, fill #0
  Filled 2023-07-17: qty 3, 28d supply, fill #1
  Filled 2023-08-15: qty 3, 28d supply, fill #2
  Filled 2023-09-14: qty 3, 28d supply, fill #3
  Filled 2023-10-16: qty 3, 28d supply, fill #4
  Filled 2023-11-16: qty 3, 28d supply, fill #5

## 2023-07-03 ENCOUNTER — Ambulatory Visit: Payer: No Typology Code available for payment source | Admitting: Cardiology

## 2023-07-07 ENCOUNTER — Other Ambulatory Visit (HOSPITAL_BASED_OUTPATIENT_CLINIC_OR_DEPARTMENT_OTHER): Payer: Self-pay

## 2023-07-17 ENCOUNTER — Other Ambulatory Visit (HOSPITAL_BASED_OUTPATIENT_CLINIC_OR_DEPARTMENT_OTHER): Payer: Self-pay

## 2023-08-16 ENCOUNTER — Other Ambulatory Visit (HOSPITAL_BASED_OUTPATIENT_CLINIC_OR_DEPARTMENT_OTHER): Payer: Self-pay

## 2023-09-14 ENCOUNTER — Other Ambulatory Visit (HOSPITAL_BASED_OUTPATIENT_CLINIC_OR_DEPARTMENT_OTHER): Payer: Self-pay

## 2023-10-16 ENCOUNTER — Other Ambulatory Visit (HOSPITAL_BASED_OUTPATIENT_CLINIC_OR_DEPARTMENT_OTHER): Payer: Self-pay

## 2023-11-16 ENCOUNTER — Other Ambulatory Visit (HOSPITAL_BASED_OUTPATIENT_CLINIC_OR_DEPARTMENT_OTHER): Payer: Self-pay

## 2023-11-28 ENCOUNTER — Other Ambulatory Visit (HOSPITAL_BASED_OUTPATIENT_CLINIC_OR_DEPARTMENT_OTHER): Payer: Self-pay

## 2023-11-30 ENCOUNTER — Other Ambulatory Visit (HOSPITAL_BASED_OUTPATIENT_CLINIC_OR_DEPARTMENT_OTHER): Payer: Self-pay

## 2023-12-07 ENCOUNTER — Other Ambulatory Visit (HOSPITAL_BASED_OUTPATIENT_CLINIC_OR_DEPARTMENT_OTHER): Payer: Self-pay

## 2023-12-11 ENCOUNTER — Other Ambulatory Visit: Payer: Self-pay | Admitting: Cardiology

## 2023-12-11 ENCOUNTER — Other Ambulatory Visit (HOSPITAL_BASED_OUTPATIENT_CLINIC_OR_DEPARTMENT_OTHER): Payer: Self-pay

## 2023-12-11 DIAGNOSIS — E663 Overweight: Secondary | ICD-10-CM

## 2023-12-11 DIAGNOSIS — I2510 Atherosclerotic heart disease of native coronary artery without angina pectoris: Secondary | ICD-10-CM

## 2023-12-11 MED ORDER — WEGOVY 2.4 MG/0.75ML ~~LOC~~ SOAJ
2.4000 mg | SUBCUTANEOUS | 5 refills | Status: AC
  Filled 2023-12-11: qty 3, 28d supply, fill #0
  Filled 2024-01-16: qty 3, 28d supply, fill #1
  Filled 2024-02-14: qty 3, 28d supply, fill #2
  Filled 2024-03-15: qty 3, 28d supply, fill #3
  Filled 2024-04-29: qty 3, 28d supply, fill #4

## 2024-01-02 ENCOUNTER — Other Ambulatory Visit (HOSPITAL_BASED_OUTPATIENT_CLINIC_OR_DEPARTMENT_OTHER): Payer: Self-pay

## 2024-01-02 ENCOUNTER — Other Ambulatory Visit: Payer: Self-pay

## 2024-01-02 DIAGNOSIS — L719 Rosacea, unspecified: Secondary | ICD-10-CM | POA: Insufficient documentation

## 2024-01-02 DIAGNOSIS — D1801 Hemangioma of skin and subcutaneous tissue: Secondary | ICD-10-CM | POA: Insufficient documentation

## 2024-01-02 DIAGNOSIS — L814 Other melanin hyperpigmentation: Secondary | ICD-10-CM | POA: Insufficient documentation

## 2024-01-02 DIAGNOSIS — L821 Other seborrheic keratosis: Secondary | ICD-10-CM | POA: Insufficient documentation

## 2024-01-02 DIAGNOSIS — L570 Actinic keratosis: Secondary | ICD-10-CM | POA: Insufficient documentation

## 2024-01-02 MED ORDER — METRONIDAZOLE 0.75 % EX CREA
TOPICAL_CREAM | CUTANEOUS | 4 refills | Status: AC
  Filled 2024-01-02: qty 45, 30d supply, fill #0

## 2024-01-02 MED ORDER — FLUOROURACIL 5 % EX CREA
TOPICAL_CREAM | CUTANEOUS | 3 refills | Status: AC
  Filled 2024-01-02: qty 40, 30d supply, fill #0

## 2024-01-03 ENCOUNTER — Other Ambulatory Visit (HOSPITAL_BASED_OUTPATIENT_CLINIC_OR_DEPARTMENT_OTHER): Payer: Self-pay

## 2024-01-03 ENCOUNTER — Other Ambulatory Visit: Payer: Self-pay

## 2024-01-16 ENCOUNTER — Other Ambulatory Visit (HOSPITAL_BASED_OUTPATIENT_CLINIC_OR_DEPARTMENT_OTHER): Payer: Self-pay

## 2024-02-14 ENCOUNTER — Other Ambulatory Visit (HOSPITAL_BASED_OUTPATIENT_CLINIC_OR_DEPARTMENT_OTHER): Payer: Self-pay

## 2024-03-07 ENCOUNTER — Other Ambulatory Visit (INDEPENDENT_AMBULATORY_CARE_PROVIDER_SITE_OTHER)

## 2024-03-07 DIAGNOSIS — E538 Deficiency of other specified B group vitamins: Secondary | ICD-10-CM | POA: Diagnosis not present

## 2024-03-07 DIAGNOSIS — Z125 Encounter for screening for malignant neoplasm of prostate: Secondary | ICD-10-CM

## 2024-03-07 DIAGNOSIS — E7800 Pure hypercholesterolemia, unspecified: Secondary | ICD-10-CM | POA: Diagnosis not present

## 2024-03-07 DIAGNOSIS — R7302 Impaired glucose tolerance (oral): Secondary | ICD-10-CM

## 2024-03-07 LAB — CBC WITH DIFFERENTIAL/PLATELET
Basophils Absolute: 0.1 K/uL (ref 0.0–0.1)
Basophils Relative: 0.9 % (ref 0.0–3.0)
Eosinophils Absolute: 0.2 K/uL (ref 0.0–0.7)
Eosinophils Relative: 4.5 % (ref 0.0–5.0)
HCT: 47.4 % (ref 39.0–52.0)
Hemoglobin: 15.9 g/dL (ref 13.0–17.0)
Lymphocytes Relative: 37.6 % (ref 12.0–46.0)
Lymphs Abs: 2 K/uL (ref 0.7–4.0)
MCHC: 33.6 g/dL (ref 30.0–36.0)
MCV: 91.8 fl (ref 78.0–100.0)
Monocytes Absolute: 0.4 K/uL (ref 0.1–1.0)
Monocytes Relative: 8.4 % (ref 3.0–12.0)
Neutro Abs: 2.6 K/uL (ref 1.4–7.7)
Neutrophils Relative %: 48.6 % (ref 43.0–77.0)
Platelets: 251 K/uL (ref 150.0–400.0)
RBC: 5.16 Mil/uL (ref 4.22–5.81)
RDW: 13.7 % (ref 11.5–15.5)
WBC: 5.3 K/uL (ref 4.0–10.5)

## 2024-03-07 LAB — BASIC METABOLIC PANEL WITH GFR
BUN: 15 mg/dL (ref 6–23)
CO2: 28 meq/L (ref 19–32)
Calcium: 9.7 mg/dL (ref 8.4–10.5)
Chloride: 103 meq/L (ref 96–112)
Creatinine, Ser: 0.94 mg/dL (ref 0.40–1.50)
GFR: 87.43 mL/min (ref >60.00)
Glucose, Bld: 99 mg/dL (ref 70–99)
Potassium: 4.3 meq/L (ref 3.5–5.1)
Sodium: 139 meq/L (ref 135–145)

## 2024-03-07 LAB — URINALYSIS, ROUTINE W REFLEX MICROSCOPIC
Bilirubin Urine: NEGATIVE
Hgb urine dipstick: NEGATIVE
Ketones, ur: NEGATIVE
Leukocytes,Ua: NEGATIVE
Nitrite: NEGATIVE
RBC / HPF: NONE SEEN (ref 0–?)
Specific Gravity, Urine: 1.02 (ref 1.000–1.030)
Total Protein, Urine: NEGATIVE
Urine Glucose: NEGATIVE
Urobilinogen, UA: 0.2 (ref 0.0–1.0)
WBC, UA: NONE SEEN (ref 0–?)
pH: 6 (ref 5.0–8.0)

## 2024-03-07 LAB — HEMOGLOBIN A1C: Hgb A1c MFr Bld: 5.7 % (ref 4.6–6.5)

## 2024-03-07 LAB — HEPATIC FUNCTION PANEL
ALT: 45 U/L (ref 0–53)
AST: 22 U/L (ref 0–37)
Albumin: 4.8 g/dL (ref 3.5–5.2)
Alkaline Phosphatase: 55 U/L (ref 39–117)
Bilirubin, Direct: 0.2 mg/dL (ref 0.0–0.3)
Total Bilirubin: 0.7 mg/dL (ref 0.2–1.2)
Total Protein: 6.5 g/dL (ref 6.0–8.3)

## 2024-03-07 LAB — PSA: PSA: 0.79 ng/mL (ref 0.10–4.00)

## 2024-03-07 LAB — LIPID PANEL
Cholesterol: 106 mg/dL (ref 0–200)
HDL: 53.6 mg/dL (ref >39.00)
LDL Cholesterol: 35 mg/dL (ref 0–99)
NonHDL: 52.4
Total CHOL/HDL Ratio: 2
Triglycerides: 88 mg/dL (ref 0.0–149.0)
VLDL: 17.6 mg/dL (ref 0.0–40.0)

## 2024-03-07 LAB — TSH: TSH: 3.03 u[IU]/mL (ref 0.35–5.50)

## 2024-03-07 LAB — VITAMIN B12: Vitamin B-12: 553 pg/mL (ref 211–911)

## 2024-03-11 ENCOUNTER — Encounter: Payer: Self-pay | Admitting: Internal Medicine

## 2024-03-11 ENCOUNTER — Ambulatory Visit: Payer: No Typology Code available for payment source | Admitting: Internal Medicine

## 2024-03-11 ENCOUNTER — Other Ambulatory Visit (HOSPITAL_BASED_OUTPATIENT_CLINIC_OR_DEPARTMENT_OTHER): Payer: Self-pay

## 2024-03-11 VITALS — BP 138/86 | HR 76 | Temp 97.5°F | Ht 67.0 in | Wt 181.6 lb

## 2024-03-11 DIAGNOSIS — E7800 Pure hypercholesterolemia, unspecified: Secondary | ICD-10-CM

## 2024-03-11 DIAGNOSIS — Z0001 Encounter for general adult medical examination with abnormal findings: Secondary | ICD-10-CM

## 2024-03-11 DIAGNOSIS — E538 Deficiency of other specified B group vitamins: Secondary | ICD-10-CM

## 2024-03-11 DIAGNOSIS — I10 Essential (primary) hypertension: Secondary | ICD-10-CM

## 2024-03-11 DIAGNOSIS — R7302 Impaired glucose tolerance (oral): Secondary | ICD-10-CM

## 2024-03-11 DIAGNOSIS — E559 Vitamin D deficiency, unspecified: Secondary | ICD-10-CM

## 2024-03-11 DIAGNOSIS — Z125 Encounter for screening for malignant neoplasm of prostate: Secondary | ICD-10-CM

## 2024-03-11 MED ORDER — AMLODIPINE BESYLATE 2.5 MG PO TABS
2.5000 mg | ORAL_TABLET | Freq: Every day | ORAL | 3 refills | Status: AC
  Filled 2024-03-11: qty 90, 90d supply, fill #0

## 2024-03-11 MED ORDER — ROSUVASTATIN CALCIUM 40 MG PO TABS
40.0000 mg | ORAL_TABLET | Freq: Every day | ORAL | 3 refills | Status: AC
  Filled 2024-03-11: qty 90, 90d supply, fill #0

## 2024-03-11 MED ORDER — EZETIMIBE 10 MG PO TABS
10.0000 mg | ORAL_TABLET | Freq: Every day | ORAL | 3 refills | Status: AC
  Filled 2024-03-11: qty 90, 90d supply, fill #0

## 2024-03-11 NOTE — Assessment & Plan Note (Signed)
 Lab Results  Component Value Date   VITAMINB12 553 03/07/2024   Stable, cont oral replacement - b12 1000 mcg qd

## 2024-03-11 NOTE — Assessment & Plan Note (Signed)
 Last vitamin D Lab Results  Component Value Date   VD25OH 32.28 02/22/2023   Low, to start oral replacement

## 2024-03-11 NOTE — Assessment & Plan Note (Signed)
 Lab Results  Component Value Date   HGBA1C 5.7 03/07/2024   Stable, pt to continue current medical treatment  - diet, wt control

## 2024-03-11 NOTE — Assessment & Plan Note (Signed)
 Lab Results  Component Value Date   LDLCALC 35 03/07/2024   Stable, pt to continue current statin crestor  40 mg every day, zetia  10 qd

## 2024-03-11 NOTE — Patient Instructions (Signed)
 Please continue all other medications as before, and refills have been done if requested.  Please have the pharmacy call with any other refills you may need.  Please continue your efforts at being more active, low cholesterol diet, and weight control.  You are otherwise up to date with prevention measures today.  Please keep your appointments with your specialists as you may have planned  Your lab testing was Good!  Please make an Appointment to return for your 1 year visit, or sooner if needed, with Lab testing by Appointment as well, to be done about 3-5 days before at the FIRST FLOOR Lab (so this is for TWO appointments - please see the scheduling desk as you leave)

## 2024-03-11 NOTE — Assessment & Plan Note (Signed)
 Age and sex appropriate education and counseling updated with regular exercise and diet Referrals for preventative services - none needed Immunizations addressed - none needed per pt today Smoking counseling  - none needed Evidence for depression or other mood disorder - none significant Most recent labs reviewed. I have personally reviewed and have noted: 1) the patient's medical and social history 2) The patient's current medications and supplements 3) The patient's height, weight, and BMI have been recorded in the chart

## 2024-03-11 NOTE — Progress Notes (Signed)
 Patient ID: Brett Griffith, male   DOB: 1961/01/26, 63 y.o.   MRN: [id number]         Chief Complaint:: wellness exam and low vit d, hyperglycemia, hld, htn, low b12       HPI:  Brett Griffith is a 63 y.o. male here for wellness exam; declines flu prevnar shot, o/w up date, plans to retire soon                        Also Pt denies chest pain, increased sob or doe, wheezing, orthopnea, PND, increased LE swelling, palpitations, dizziness or syncope.   Pt denies polydipsia, polyuria, or new focal neuro s/s.    Pt denies fever, wt loss, night sweats, loss of appetite, or other constitutional symptoms  Plans to f/u with cardiology soon. Has lost significant wt with wegovy .  Bps have been well controlled at home.   No longer sees chiropracter for msk pain after his wt loss.  Wt Readings from Last 3 Encounters:  03/11/24 181 lb 9.6 oz (82.4 kg)  05/23/23 175 lb (79.4 kg)  04/20/23 175 lb (79.4 kg)   BP Readings from Last 3 Encounters:  03/11/24 138/86  05/23/23 120/67  03/09/23 132/82   Immunization History  Administered Date(s) Administered   DTaP 06/28/2010   Influenza Inj Mdck Quad Pf 01/27/2022   Influenza Split 12/26/2011   Influenza, Seasonal, Injecte, Preservative Fre 03/09/2023   Influenza,inj,Quad PF,6+ Mos 12/05/2013, 02/25/2015, 02/04/2016   Influenza-Unspecified 01/25/2017, 12/26/2017, 12/11/2018, 01/20/2020, 02/12/2021   PFIZER(Purple Top)SARS-COV-2 Vaccination 05/20/2019, 06/24/2019, 01/06/2020   Td 08/27/2007   Tdap 02/09/2018   Zoster Recombinant(Shingrix) 03/23/2019, 05/25/2019   Health Maintenance Due  Topic Date Due   Pneumococcal Vaccine: 50+ Years (1 of 2 - PCV) Never done   Influenza Vaccine  10/27/2023   COVID-19 Vaccine (4 - 2025-26 season) 11/27/2023      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   moderate-severe right post neck   Atypical nevus 02/26/2018   left post sholder superior--moderate   History of kidney  stones    HYPERLIPIDEMIA 01/08/2009   Hypertension 12/27/2018   Impaired glucose tolerance 09/21/2010   NEPHROLITHIASIS, HX OF 01/08/2009   Shingles    TIA (transient ischemic attack) 2010   Past Surgical History:  Procedure Laterality Date   EYE SURGERY  10/26/2016   INSERTION OF MESH N/A 08/10/2016   Procedure: INSERTION OF MESH;  Surgeon: Signe Mitzie LABOR, MD;  Location: [facility];  Service: General;  Laterality: N/A;   s/p ganglion cyst     right wrist   s/p pilonidal cystectomy     UMBILICAL HERNIA REPAIR N/A 08/10/2016   Procedure: UMBILICAL HERNIA REPAIR;  Surgeon: Signe Mitzie LABOR, MD;  Location: [facility];  Service: General;  Laterality: N/A;   WISDOM TOOTH EXTRACTION      reports that he has never smoked. He has never used smokeless tobacco. He reports current alcohol use of about 3.0 standard drinks of alcohol per week. He reports that he does not use drugs. family history includes CAD (age of onset: 42) in his brother; CAD (age of onset: 58) in his father; COPD in his mother; Diabetes in his sister; Hearing loss in his father; Heart disease in his brother; Hypertension in his brother, father, and mother; Peripheral vascular disease (age of onset: 65) in his mother. Allergies[1] Medications Ordered Prior to Encounter[2]  ROS:  All others reviewed and negative.  Objective        PE:  BP 138/86   Pulse 76   Temp (!) 97.5 F (36.4 C)   Ht 5' 7 (1.702 m)   Wt 181 lb 9.6 oz (82.4 kg)   SpO2 98%   BMI 28.44 kg/m                 Constitutional: Pt appears in NAD               HENT: Head: NCAT.                Right Ear: External ear normal.                 Left Ear: External ear normal.                Eyes: . Pupils are equal, round, and reactive to light. Conjunctivae and EOM are normal               Nose: without d/c or deformity               Neck: Neck supple. Gross normal ROM               Cardiovascular: Normal rate and regular  rhythm.                 Pulmonary/Chest: Effort normal and breath sounds without rales or wheezing.                Abd:  Soft, NT, ND, + BS, no organomegaly               Neurological: Pt is alert. At baseline orientation, motor grossly intact               Skin: Skin is warm. No rashes, no other new lesions, LE edema - none               Psychiatric: Pt behavior is normal without agitation   Micro: none  Cardiac tracings I have personally interpreted today:  none  Pertinent Radiological findings (summarize): none   Lab Results  Component Value Date   WBC 5.3 03/07/2024   HGB 15.9 03/07/2024   HCT 47.4 03/07/2024   PLT 251.0 03/07/2024   GLUCOSE 99 03/07/2024   CHOL 106 03/07/2024   TRIG 88.0 03/07/2024   HDL 53.60 03/07/2024   LDLDIRECT 51 12/30/2019   LDLCALC 35 03/07/2024   ALT 45 03/07/2024   AST 22 03/07/2024   NA 139 03/07/2024   K 4.3 03/07/2024   CL 103 03/07/2024   CREATININE 0.94 03/07/2024   BUN 15 03/07/2024   CO2 28 03/07/2024   TSH 3.03 03/07/2024   PSA 0.79 03/07/2024   INR 0.97 10/07/2009   HGBA1C 5.7 03/07/2024   Assessment/Plan:  Brett Griffith is a 63 y.o. White or Caucasian [1] male with  has a past medical history of Allergy, Atypical nevus (02/23/2017), Atypical nevus (02/26/2018), History of kidney stones, HYPERLIPIDEMIA (01/08/2009), Hypertension (12/27/2018), Impaired glucose tolerance (09/21/2010), NEPHROLITHIASIS, HX OF (01/08/2009), Shingles, and TIA (transient ischemic attack) (2010).  Encounter for well adult exam with abnormal findings Age and sex appropriate education and counseling updated with regular exercise and diet Referrals for preventative services - none needed Immunizations addressed - none needed per pt today Smoking counseling  - none needed Evidence for depression or other mood disorder - none significant Most recent labs reviewed. I have  personally reviewed and have noted: 1) the patient's medical and social history 2) The  patient's current medications and supplements 3) The patient's height, weight, and BMI have been recorded in the chart   Vitamin D  deficiency Last vitamin D  Lab Results  Component Value Date   VD25OH 32.28 02/22/2023   Low, to start oral replacement   Impaired glucose tolerance Lab Results  Component Value Date   HGBA1C 5.7 03/07/2024   Stable, pt to continue current medical treatment  - diet, wt control   Hyperlipidemia Lab Results  Component Value Date   LDLCALC 35 03/07/2024   Stable, pt to continue current statin crestor  40 mg every day, zetia  10 qd   Essential hypertension BP Readings from Last 3 Encounters:  03/11/24 138/86  05/23/23 120/67  03/09/23 132/82   Stable, pt to continue medical treatment norvasc  2.5 every day,    B12 deficiency Lab Results  Component Value Date   VITAMINB12 553 03/07/2024   Stable, cont oral replacement - b12 1000 mcg qd  Followup: Return in about 1 year (around 03/11/2025).  Lynwood Rush, MD 03/11/2024 2:32 PM [facility]    [1]  Allergies Allergen Reactions   Ezetimibe -Simvastatin Other (See Comments)    REACTION: more emotional  [2]  Current Outpatient Medications on File Prior to Visit  Medication Sig Dispense Refill   aspirin  EC 81 MG tablet Take 1 tablet (81 mg total) by mouth daily. Swallow whole. 100 tablet 99   cyanocobalamin  (VITAMIN B12) 500 MCG tablet Take 500 mcg by mouth daily.     cyclobenzaprine  (FLEXERIL ) 5 MG tablet Take 1 tablet (5 mg total) by mouth 3 (three) times daily as needed for muscle spasms. 90 tablet 3   Diclofenac  Sodium 2 % SOLN Place 2 g onto the skin 2 (two) times daily. (Patient taking differently: Place 2 g onto the skin 2 (two) times daily as needed.) 112 g 3   fluorouracil  (EFUDEX ) 5 % cream Apply topically 2 times daily for 10 days to scalp and forehead in winter time 40 g 3   metroNIDAZOLE  (METROCREAM ) 0.75 %  cream Apply a pea sized amount to the affected areas on the nose, cheeks, and chin. 45 g 4   Multiple Vitamin (MULTIVITAMIN WITH MINERALS) TABS tablet Take 1 tablet by mouth daily.     semaglutide -weight management (WEGOVY ) 2.4 MG/0.75ML SOAJ SQ injection Inject 2.4 mg into the skin once a week. 3 mL 5   No current facility-administered medications on file prior to visit.

## 2024-03-11 NOTE — Assessment & Plan Note (Signed)
 BP Readings from Last 3 Encounters:  03/11/24 138/86  05/23/23 120/67  03/09/23 132/82   Stable, pt to continue medical treatment norvasc  2.5 every day,

## 2024-03-15 ENCOUNTER — Other Ambulatory Visit (HOSPITAL_BASED_OUTPATIENT_CLINIC_OR_DEPARTMENT_OTHER): Payer: Self-pay

## 2024-04-19 ENCOUNTER — Other Ambulatory Visit (HOSPITAL_COMMUNITY): Payer: Self-pay

## 2024-04-29 ENCOUNTER — Other Ambulatory Visit (HOSPITAL_BASED_OUTPATIENT_CLINIC_OR_DEPARTMENT_OTHER): Payer: Self-pay

## 2024-04-30 ENCOUNTER — Other Ambulatory Visit (HOSPITAL_BASED_OUTPATIENT_CLINIC_OR_DEPARTMENT_OTHER): Payer: Self-pay

## 2024-05-02 DIAGNOSIS — R931 Abnormal findings on diagnostic imaging of heart and coronary circulation: Secondary | ICD-10-CM | POA: Insufficient documentation

## 2024-05-02 DIAGNOSIS — E118 Type 2 diabetes mellitus with unspecified complications: Secondary | ICD-10-CM | POA: Insufficient documentation

## 2024-05-02 NOTE — Progress Notes (Unsigned)
 " Cardiology Office Note:   Date:  05/03/2024  ID:  Brett Griffith, DOB [birth year], MRN [id number] PCP: Patient, No Pcp Per  [facility] Providers Cardiologist:  Lynwood Schilling, MD {  History of Present Illness:   Brett Griffith is a 64 y.o. male who presents for evaluation of elevated coronary calcium .   I saw him in  2016 for evaluation of dyspnea.  He had extensive coronary calcium .  However, he had a negative stress echo in Jan of 2017 and a negative POET (Plain Old Exercise Treadmill) in 2018.     He had follow up coronary calcium  ordered by Dr. Norleen.  His level in 2017 was 468 and is now up to 1126.  At still around the 95th 97th percentile.  He remains asymptomatic.  He scheduled 3 driveways recently with a snow. The patient denies any new symptoms such as chest discomfort, neck or arm discomfort. There has been no new shortness of breath, PND or orthopnea. There have been no reported palpitations, presyncope or syncope.  He has not been going to the gym as much because he was taking care of his father who recently died in hospice.  He plans on getting back in the gym.    ROS: As stated in the HPI and negative for all other systems.  Studies Reviewed:    EKG:   EKG Interpretation Date/Time:  Friday May 03 2024 13:01:11 EST Ventricular Rate:  75 PR Interval:  202 QRS Duration:  76 QT Interval:  374 QTC Calculation: 417 R Axis:   49  Text Interpretation: Normal sinus rhythm Normal ECG  no change compared to 2024 Confirmed by Schilling Rattan ([id number]) on 05/03/2024 1:06:14 PM    Risk Assessment/Calculations:              Physical Exam:   VS:  BP 130/72   Pulse 75   Ht 5' 7 (1.702 m)   Wt 185 lb 3.2 oz (84 kg)   SpO2 97%   BMI 29.01 kg/m    Wt Readings from Last 3 Encounters:  05/03/24 185 lb 3.2 oz (84 kg)  03/11/24 181 lb 9.6 oz (82.4 kg)  05/23/23 175 lb (79.4 kg)     GEN: Well nourished, well developed in no acute distress NECK: No JVD; No carotid  bruits CARDIAC: RRR, no murmurs, rubs, gallops RESPIRATORY:  Clear to auscultation without rales, wheezing or rhonchi  ABDOMEN: Soft, non-tender, non-distended EXTREMITIES:  No edema; No deformity   ASSESSMENT AND PLAN:   CORONARY CALICIUM:   He had a negative POET (Plain Old Exercise Treadmill) with excellent exercise tolerance in 2022.  He has no new symptoms.  We are going to continue to pursue aggressive risk reduction.   DM: His A1c is improved to 5.7 from previous 6.5.  He will continue with current therapy.   DYSLIPIDEMIA:    His LDL was 35 with an HDL of 46.3.  I plan to have him come back for fasting NMR and high-sensitivity C-reactive protein and might make some suggestions based on this.  I would actually consider putting this patient on colchicine pending the results of his hs-CRP.   HTN:   I reviewed his blood pressure diary on his phone and it is excellent.  No change in therapy.  OVERWEIGHT:   He is now on Wegovy  has had good weight loss.  We talked about diet and exercise again today.       Follow up his insurance is  having him switch to [facility].  Signed, Lynwood Schilling, MD   "

## 2024-05-03 ENCOUNTER — Other Ambulatory Visit: Payer: Self-pay

## 2024-05-03 ENCOUNTER — Ambulatory Visit: Payer: Self-pay | Admitting: Cardiology

## 2024-05-03 VITALS — BP 130/72 | HR 75 | Ht 67.0 in | Wt 185.2 lb

## 2024-05-03 DIAGNOSIS — R931 Abnormal findings on diagnostic imaging of heart and coronary circulation: Secondary | ICD-10-CM

## 2024-05-03 DIAGNOSIS — Z85828 Personal history of other malignant neoplasm of skin: Secondary | ICD-10-CM | POA: Insufficient documentation

## 2024-05-03 DIAGNOSIS — B029 Zoster without complications: Secondary | ICD-10-CM | POA: Insufficient documentation

## 2024-05-03 DIAGNOSIS — I10 Essential (primary) hypertension: Secondary | ICD-10-CM

## 2024-05-03 DIAGNOSIS — T7840XA Allergy, unspecified, initial encounter: Secondary | ICD-10-CM | POA: Insufficient documentation

## 2024-05-03 DIAGNOSIS — E118 Type 2 diabetes mellitus with unspecified complications: Secondary | ICD-10-CM

## 2024-05-03 DIAGNOSIS — D1800 Hemangioma unspecified site: Secondary | ICD-10-CM | POA: Insufficient documentation

## 2024-05-03 DIAGNOSIS — E785 Hyperlipidemia, unspecified: Secondary | ICD-10-CM

## 2024-05-03 DIAGNOSIS — Z87442 Personal history of urinary calculi: Secondary | ICD-10-CM | POA: Insufficient documentation

## 2024-05-03 NOTE — Patient Instructions (Signed)
 Medication Instructions:  Your physician recommends that you continue on your current medications as directed. Please refer to the Current Medication list given to you today.  *If you need a refill on your cardiac medications before your next appointment, please call your pharmacy*  Lab Work: Fasting NMR and HS CRP at [facility] If you have labs (blood work) drawn today and your tests are completely normal, you will receive your results only by: MyChart Message (if you have MyChart) OR A paper copy in the mail If you have any lab test that is abnormal or we need to change your treatment, we will call you to review the results.  Testing/Procedures: NONE  Follow-Up: At [facility], you and your health needs are our priority.  As part of our continuing mission to provide you with exceptional heart care, our providers are all part of one team.  This team includes your primary Cardiologist (physician) and Advanced Practice Providers or APPs (Physician Assistants and Nurse Practitioners) who all work together to provide you with the care you need, when you need it.  Your next appointment:   As needed  Provider:   Lynwood Schilling, MD    We recommend signing up for the patient portal called MyChart.  Sign up information is provided on this After Visit Summary.  MyChart is used to connect with patients for Virtual Visits (Telemedicine).  Patients are able to view lab/test results, encounter notes, upcoming appointments, etc.  Non-urgent messages can be sent to your provider as well.   To learn more about what you can do with MyChart, go to [email or web address].
